# Patient Record
Sex: Female | Born: 1970 | Race: Black or African American | Hispanic: No | Marital: Married | Smoking: Never smoker
Health system: Southern US, Community
[De-identification: ages and names within clinical notes are randomized; demographics above are authoritative.]

## PROBLEM LIST (undated history)

## (undated) HISTORY — PX: CARPAL TUNNEL RELEASE: SHX101

---

## 2015-04-07 ENCOUNTER — Inpatient Hospital Stay
Admission: EM | Admit: 2015-04-07 | Discharge: 2015-04-09 | DRG: 195 | Disposition: A | Discharge status: Home / Self Care | Payer: Self-pay | Attending: Internal Medicine | Admitting: Internal Medicine

## 2015-04-07 ENCOUNTER — Encounter: Payer: Self-pay | Admitting: Student in an Organized Health Care Education/Training Program

## 2015-04-07 ENCOUNTER — Inpatient Hospital Stay: Payer: Self-pay | Admitting: Student in an Organized Health Care Education/Training Program

## 2015-04-07 ENCOUNTER — Emergency Department: Payer: Self-pay

## 2015-04-07 DIAGNOSIS — R062 Wheezing: Secondary | ICD-10-CM | POA: Diagnosis present

## 2015-04-07 DIAGNOSIS — J189 Pneumonia, unspecified organism: Principal | ICD-10-CM | POA: Diagnosis present

## 2015-04-07 DIAGNOSIS — R0682 Tachypnea, not elsewhere classified: Secondary | ICD-10-CM | POA: Diagnosis present

## 2015-04-07 DIAGNOSIS — R059 Cough, unspecified: Secondary | ICD-10-CM

## 2015-04-07 DIAGNOSIS — R739 Hyperglycemia, unspecified: Secondary | ICD-10-CM | POA: Diagnosis present

## 2015-04-07 DIAGNOSIS — E876 Hypokalemia: Secondary | ICD-10-CM | POA: Diagnosis present

## 2015-04-07 DIAGNOSIS — R030 Elevated blood-pressure reading, without diagnosis of hypertension: Secondary | ICD-10-CM | POA: Diagnosis present

## 2015-04-07 LAB — COMPREHENSIVE METABOLIC PANEL
ALT: 7 U/L (ref 0–55)
ALT: 7 U/L (ref 0–55)
AST (SGOT): 17 U/L (ref 5–34)
AST (SGOT): 15 U/L (ref 5–34)
Albumin/Globulin Ratio: 1.2 (ref 0.9–2.2)
Albumin/Globulin Ratio: 1.1 (ref 0.9–2.2)
Albumin: 3.7 g/dL (ref 3.5–5.0)
Albumin: 3.4 g/dL — ABNORMAL LOW (ref 3.5–5.0)
Alkaline Phosphatase: 50 U/L (ref 37–106)
Alkaline Phosphatase: 41 U/L (ref 37–106)
Anion Gap: 10 (ref 5.0–15.0)
Anion Gap: 10 (ref 5.0–15.0)
BUN: 13 mg/dL (ref 7.0–19.0)
BUN: 11 mg/dL (ref 7.0–19.0)
Bilirubin, Total: 0.2 mg/dL (ref 0.2–1.2)
Bilirubin, Total: 0.1 mg/dL — ABNORMAL LOW (ref 0.2–1.2)
CO2: 20 mEq/L — ABNORMAL LOW (ref 22–29)
CO2: 17 mEq/L — ABNORMAL LOW (ref 22–29)
Calcium: 8.7 mg/dL (ref 8.5–10.5)
Calcium: 7.6 mg/dL — ABNORMAL LOW (ref 8.5–10.5)
Chloride: 110 mEq/L (ref 100–111)
Chloride: 112 mEq/L — ABNORMAL HIGH (ref 100–111)
Creatinine: 0.8 mg/dL (ref 0.6–1.0)
Creatinine: 0.8 mg/dL (ref 0.6–1.0)
Globulin: 3.1 g/dL (ref 2.0–3.6)
Globulin: 3 g/dL (ref 2.0–3.6)
Glucose: 81 mg/dL (ref 70–100)
Glucose: 207 mg/dL — ABNORMAL HIGH (ref 70–100)
Potassium: 4.6 mEq/L (ref 3.5–5.1)
Potassium: 3 mEq/L — ABNORMAL LOW (ref 3.5–5.1)
Protein, Total: 6.8 g/dL (ref 6.0–8.3)
Protein, Total: 6.4 g/dL (ref 6.0–8.3)
Sodium: 140 mEq/L (ref 136–145)
Sodium: 139 mEq/L (ref 136–145)

## 2015-04-07 LAB — GFR
EGFR: >60
EGFR: >60

## 2015-04-07 LAB — CBC AND DIFFERENTIAL
Basophils Absolute Automated: 0.02 10*3/uL (ref 0.00–0.20)
Basophils Absolute Automated: 0.01 10*3/uL (ref 0.00–0.20)
Basophils Automated: 0 %
Basophils Automated: 0 %
Eosinophils Absolute Automated: 0.39 10*3/uL (ref 0.00–0.70)
Eosinophils Absolute Automated: 0.02 10*3/uL (ref 0.00–0.70)
Eosinophils Automated: 6 %
Eosinophils Automated: 0 %
Hematocrit: 36.5 % — ABNORMAL LOW (ref 37.0–47.0)
Hematocrit: 32.9 % — ABNORMAL LOW (ref 37.0–47.0)
Hgb: 11.4 g/dL — ABNORMAL LOW (ref 12.0–16.0)
Hgb: 10.3 g/dL — ABNORMAL LOW (ref 12.0–16.0)
Immature Granulocytes Absolute: 0.01 10*3/uL
Immature Granulocytes Absolute: 0.03 10*3/uL
Immature Granulocytes: 0 %
Immature Granulocytes: 0 %
Lymphocytes Absolute Automated: 2.84 10*3/uL (ref 0.50–4.40)
Lymphocytes Absolute Automated: 0.7 10*3/uL (ref 0.50–4.40)
Lymphocytes Automated: 42 %
Lymphocytes Automated: 8 %
MCH: 27.4 pg — ABNORMAL LOW (ref 28.0–32.0)
MCH: 27.5 pg — ABNORMAL LOW (ref 28.0–32.0)
MCHC: 31.2 g/dL — ABNORMAL LOW (ref 32.0–36.0)
MCHC: 31.3 g/dL — ABNORMAL LOW (ref 32.0–36.0)
MCV: 87.7 fL (ref 80.0–100.0)
MCV: 87.7 fL (ref 80.0–100.0)
MPV: 9.8 fL (ref 9.4–12.3)
MPV: 10.1 fL (ref 9.4–12.3)
Monocytes Absolute Automated: 0.91 10*3/uL (ref 0.00–1.20)
Monocytes Absolute Automated: 0.2 10*3/uL (ref 0.00–1.20)
Monocytes: 13 %
Monocytes: 2 %
Neutrophils Absolute: 2.68 10*3/uL (ref 1.80–8.10)
Neutrophils Absolute: 8.46 10*3/uL — ABNORMAL HIGH (ref 1.80–8.10)
Neutrophils: 39 %
Neutrophils: 90 %
Nucleated RBC: 0 /100 WBC (ref 0–1)
Nucleated RBC: 0 /100 WBC (ref 0–1)
Platelets: 254 10*3/uL (ref 140–400)
Platelets: 235 10*3/uL (ref 140–400)
RBC: 4.16 10*6/uL — ABNORMAL LOW (ref 4.20–5.40)
RBC: 3.75 10*6/uL — ABNORMAL LOW (ref 4.20–5.40)
RDW: 14 % (ref 12–15)
RDW: 14 % (ref 12–15)
WBC: 6.84 10*3/uL (ref 3.50–10.80)
WBC: 9.39 10*3/uL (ref 3.50–10.80)

## 2015-04-07 LAB — HEMOLYSIS INDEX
Hemolysis Index: 10 (ref 0–18)
Hemolysis Index: 1 (ref 0–18)

## 2015-04-07 LAB — MAGNESIUM: Magnesium: 1.5 mg/dL — ABNORMAL LOW (ref 1.6–2.6)

## 2015-04-07 LAB — PHOSPHORUS: Phosphorus: 2.7 mg/dL (ref 2.3–4.7)

## 2015-04-07 MED ORDER — HYDROXYZINE HCL 10 MG PO TABS
10.0000 mg | ORAL_TABLET | Freq: Once | ORAL | Status: AC
Start: 2015-04-07 — End: 2015-04-07
  Administered 2015-04-07: 10 mg via ORAL
  Filled 2015-04-07: qty 1

## 2015-04-07 MED ORDER — HYDROCODONE-ACETAMINOPHEN 5-325 MG PO TABS
1.0000 | ORAL_TABLET | Freq: Once | ORAL | Status: AC
Start: 2015-04-07 — End: 2015-04-07
  Administered 2015-04-07: 1 via ORAL
  Filled 2015-04-07: qty 1

## 2015-04-07 MED ORDER — ACETAMINOPHEN 325 MG PO TABS
650.0000 mg | ORAL_TABLET | ORAL | Status: DC | PRN
Start: 2015-04-07 — End: 2015-04-09
  Administered 2015-04-07 – 2015-04-08 (×4): 650 mg via ORAL
  Filled 2015-04-07 (×4): qty 2

## 2015-04-07 MED ORDER — RACEPINEPHRINE HCL 2.25 % IN NEBU
INHALATION_SOLUTION | RESPIRATORY_TRACT | Status: AC
Start: 2015-04-07 — End: 2015-04-07
  Administered 2015-04-07: 0.5 mL
  Filled 2015-04-07: qty 0.5

## 2015-04-07 MED ORDER — BENZOCAINE-MENTHOL 15-3.6 MG MT LOZG
1.0000 | LOZENGE | OROMUCOSAL | Status: DC | PRN
Start: 2015-04-07 — End: 2015-04-09
  Administered 2015-04-07 (×2): 1 via BUCCAL
  Filled 2015-04-07 (×24): qty 1

## 2015-04-07 MED ORDER — ONDANSETRON HCL 4 MG/2ML IJ SOLN
4.0000 mg | Freq: Four times a day (QID) | INTRAMUSCULAR | Status: DC | PRN
Start: 2015-04-07 — End: 2015-04-09

## 2015-04-07 MED ORDER — ALBUTEROL-IPRATROPIUM 2.5-0.5 (3) MG/3ML IN SOLN
3.0000 mL | RESPIRATORY_TRACT | Status: DC
Start: 2015-04-07 — End: 2015-04-09
  Administered 2015-04-07 – 2015-04-09 (×13): 3 mL via RESPIRATORY_TRACT
  Filled 2015-04-07 (×16): qty 3

## 2015-04-07 MED ORDER — CYCLOBENZAPRINE HCL 10 MG PO TABS
10.0000 mg | ORAL_TABLET | Freq: Three times a day (TID) | ORAL | Status: DC | PRN
Start: 2015-04-07 — End: 2015-04-09
  Administered 2015-04-07 – 2015-04-08 (×2): 10 mg via ORAL
  Filled 2015-04-07 (×4): qty 1

## 2015-04-07 MED ORDER — SODIUM CHLORIDE 0.9 % IV BOLUS
1000.0000 mL | Freq: Once | INTRAVENOUS | Status: AC
Start: 2015-04-07 — End: 2015-04-07
  Administered 2015-04-07: 1000 mL via INTRAVENOUS

## 2015-04-07 MED ORDER — ENOXAPARIN SODIUM 40 MG/0.4ML SC SOLN
40.0000 mg | Freq: Every day | SUBCUTANEOUS | Status: DC
Start: 2015-04-07 — End: 2015-04-09
  Filled 2015-04-07 (×2): qty 0.4

## 2015-04-07 MED ORDER — ACETAMINOPHEN 650 MG RE SUPP
650.0000 mg | RECTAL | Status: DC | PRN
Start: 2015-04-07 — End: 2015-04-09

## 2015-04-07 MED ORDER — SODIUM CHLORIDE 0.9 % IV MBP
1.0000 g | Freq: Once | INTRAVENOUS | Status: AC
Start: 2015-04-07 — End: 2015-04-07
  Administered 2015-04-07: 1 g via INTRAVENOUS
  Filled 2015-04-07: qty 100
  Filled 2015-04-07: qty 1000

## 2015-04-07 MED ORDER — SODIUM CHLORIDE 0.9 % IV SOLN
INTRAVENOUS | Status: DC
Start: 2015-04-07 — End: 2015-04-08

## 2015-04-07 MED ORDER — SODIUM CHLORIDE 0.9 % IV MBP
1.0000 g | INTRAVENOUS | Status: DC
Start: 2015-04-08 — End: 2015-04-09
  Administered 2015-04-07 – 2015-04-09 (×2): 1 g via INTRAVENOUS
  Filled 2015-04-07: qty 100
  Filled 2015-04-07 (×2): qty 1000
  Filled 2015-04-07: qty 100

## 2015-04-07 MED ORDER — PREDNISONE 20 MG PO TABS
40.0000 mg | ORAL_TABLET | Freq: Every morning | ORAL | Status: DC
Start: 2015-04-07 — End: 2015-04-08
  Administered 2015-04-07 – 2015-04-08 (×2): 40 mg via ORAL
  Filled 2015-04-07 (×2): qty 2

## 2015-04-07 MED ORDER — GUAIFENESIN-CODEINE 100-10 MG/5ML PO SYRP
10.0000 mL | ORAL_SOLUTION | ORAL | Status: DC | PRN
Start: 2015-04-07 — End: 2015-04-09
  Administered 2015-04-07 – 2015-04-09 (×8): 10 mL via ORAL
  Filled 2015-04-07 (×8): qty 10

## 2015-04-07 MED ORDER — RACEPINEPHRINE HCL 2.25 % IN NEBU
0.5000 mL | INHALATION_SOLUTION | Freq: Once | RESPIRATORY_TRACT | Status: DC
Start: 2015-04-07 — End: 2015-04-09
  Filled 2015-04-07: qty 0.5

## 2015-04-07 MED ORDER — IPRATROPIUM BROMIDE 0.02 % IN SOLN
0.5000 mg | Freq: Once | RESPIRATORY_TRACT | Status: AC
Start: 2015-04-07 — End: 2015-04-07
  Administered 2015-04-07: 0.5 mg via RESPIRATORY_TRACT
  Filled 2015-04-07: qty 2.5

## 2015-04-07 MED ORDER — METHYLPREDNISOLONE SODIUM SUCC 125 MG IJ SOLR
125.0000 mg | Freq: Once | INTRAMUSCULAR | Status: AC
Start: 2015-04-07 — End: 2015-04-07
  Administered 2015-04-07: 125 mg via INTRAVENOUS
  Filled 2015-04-07: qty 2

## 2015-04-07 MED ORDER — LIDOCAINE HCL 2 % IJ SOLN
INTRAMUSCULAR | Status: AC
Start: 2015-04-07 — End: 2015-04-07
  Filled 2015-04-07: qty 20

## 2015-04-07 MED ORDER — BENZONATATE 100 MG PO CAPS
200.0000 mg | ORAL_CAPSULE | Freq: Once | ORAL | Status: AC
Start: 2015-04-07 — End: 2015-04-07
  Administered 2015-04-07: 200 mg via ORAL
  Filled 2015-04-07: qty 2

## 2015-04-07 MED ORDER — BENZONATATE 100 MG PO CAPS
100.0000 mg | ORAL_CAPSULE | Freq: Three times a day (TID) | ORAL | Status: DC | PRN
Start: 2015-04-07 — End: 2015-04-09
  Administered 2015-04-07: 100 mg via ORAL
  Filled 2015-04-07: qty 1

## 2015-04-07 MED ORDER — ALBUTEROL SULFATE (2.5 MG/3ML) 0.083% IN NEBU
7.5000 mg | INHALATION_SOLUTION | Freq: Once | RESPIRATORY_TRACT | Status: AC
Start: 2015-04-07 — End: 2015-04-07
  Administered 2015-04-07: 7.5 mg via RESPIRATORY_TRACT
  Filled 2015-04-07: qty 9

## 2015-04-07 MED ORDER — AZITHROMYCIN 250 MG PO TABS
500.0000 mg | ORAL_TABLET | Freq: Once | ORAL | Status: AC
Start: 2015-04-07 — End: 2015-04-07
  Administered 2015-04-07: 500 mg via ORAL
  Filled 2015-04-07: qty 2

## 2015-04-07 MED ORDER — SODIUM CHLORIDE 0.9 % IV SOLN
250.0000 mg | INTRAVENOUS | Status: DC
Start: 2015-04-08 — End: 2015-04-09
  Administered 2015-04-08 – 2015-04-09 (×2): 250 mg via INTRAVENOUS
  Filled 2015-04-07 (×3): qty 250

## 2015-04-07 MED ORDER — SODIUM CHLORIDE 0.9 % IN NEBU
3.0000 mL | INHALATION_SOLUTION | Freq: Once | RESPIRATORY_TRACT | Status: AC
Start: 2015-04-07 — End: 2015-04-07
  Administered 2015-04-07: 3 mL via RESPIRATORY_TRACT

## 2015-04-07 MED ORDER — NALOXONE HCL 0.4 MG/ML IJ SOLN (WRAP)
0.2000 mg | INTRAMUSCULAR | Status: DC | PRN
Start: 2015-04-07 — End: 2015-04-09

## 2015-04-07 MED ORDER — GUAIFENESIN ER 600 MG PO TB12
600.0000 mg | ORAL_TABLET | Freq: Two times a day (BID) | ORAL | Status: DC
Start: 2015-04-07 — End: 2015-04-09
  Administered 2015-04-07 – 2015-04-09 (×5): 600 mg via ORAL
  Filled 2015-04-07 (×6): qty 1

## 2015-04-07 MED ORDER — POTASSIUM CHLORIDE CRYS ER 20 MEQ PO TBCR
40.0000 meq | EXTENDED_RELEASE_TABLET | Freq: Once | ORAL | Status: AC
Start: 2015-04-07 — End: 2015-04-07
  Administered 2015-04-07: 40 meq via ORAL
  Filled 2015-04-07: qty 2

## 2015-04-07 MED ORDER — TRAZODONE HCL 50 MG PO TABS
50.0000 mg | ORAL_TABLET | Freq: Every evening | ORAL | Status: DC
Start: 2015-04-07 — End: 2015-04-09
  Administered 2015-04-07 – 2015-04-08 (×3): 50 mg via ORAL
  Filled 2015-04-07 (×3): qty 1

## 2015-04-07 MED ORDER — ONDANSETRON 4 MG PO TBDP
4.0000 mg | ORAL_TABLET | Freq: Four times a day (QID) | ORAL | Status: DC | PRN
Start: 2015-04-07 — End: 2015-04-09

## 2015-04-07 NOTE — ED Notes (Signed)
Bed: GR12  Expected date:   Expected time:   Means of arrival:   Comments:

## 2015-04-07 NOTE — ED Notes (Signed)
Pt presents with SOB with a cough and wheezing. Pt denies having asthma, but has constant coughing and inability to catch her breath. Symptoms started 2 days ago.

## 2015-04-07 NOTE — ED Provider Notes (Signed)
EMERGENCY DEPARTMENT HISTORY AND PHYSICAL EXAM     Physician/Midlevel provider first contact with patient: 04/07/15 0110         Date: 04/07/2015  Patient Name: Wendy Buckley    History of Presenting Illness     Chief Complaint   Patient presents with   . Shortness of Breath   . Chest Pain   . Cough       History Provided By: Patient    Chief Complaint: Cough  Onset: Two days ago  Timing: Intermittent  Location: Respiratory  Severity: Moderate  Associated Symptoms: Chest pain, wheezing, and SOB    Additional History: Wendy Buckley is a 45 y.o. female presenting to the ED complaining of an intermittent cough, which began two days ago. Pt notes no hx of asthma and does not smoke. Pt notes associated chest pain, wheezing, and SOB.       PCP: No primary care provider on file.  SPECIALISTS:    Current Facility-Administered Medications   Medication Dose Route Frequency Provider Last Rate Last Dose   . cefTRIAXone (ROCEPHIN) 1 g in sodium chloride 0.9 % 100 mL IVPB mini-bag plus  1 g Intravenous Once Cherlyn Roberts, MD 200 mL/hr at 04/07/15 0233 1 g at 04/07/15 0233   . guaiFENesin-codeine (ROBITUSSIN AC) 100-10 MG/5ML syrup 10 mL  10 mL Oral Q4H PRN Cherlyn Roberts, MD   10 mL at 04/07/15 0215   . lidocaine (XYLOCAINE) 2 % injection            . racepinephrine (VAPONEFRIN) 2.25 % nebulizer solution 0.5 mL  0.5 mL Nebulization Once Cherlyn Roberts, MD         No current outpatient prescriptions on file.       Past History     Past Medical History:  No past medical history on file.    Past Surgical History:  No past surgical history on file.    Family History:  No family history on file.    Social History:  Social History   Substance Use Topics   . Smoking status: Not on file   . Smokeless tobacco: Not on file   . Alcohol Use: Not on file       Allergies:  No Known Allergies    Review of Systems     Constitutional: Negative for fever or chills.   Neurological: Negative for speech changes, weakness, or  numbness.  Eyes: Negative for visual changes or eye pain.  HENT: No headache. Negative for sore throat, neck pain, or runny nose.   Cardiovascular: Positive for chest pain.   Respiratory: Positive for shortness of breath, wheezing, and cough.  Gastrointestinal: Negative for abdominal pain, nausea, vomiting, diarrhea, or blood in stool.   Genitourinary: Negative for dysuria or hematuria.  Musculoskeletal: Negative for gait changes, joint pain or muscle pain.   Skin: Negative for itching or rash.   Hematological: Negative for easy bruising      Physical Exam   BP 145/82 mmHg  Pulse 112  Temp(Src) 98.5 F (36.9 C) (Temporal Artery)  Resp 22  SpO2 99%    Physical Exam   Constitutional: Oriented to person, place, and time and well-developed, well-nourished. Actively coughing, moderate respiratory distress.  Head: Normocephalic and atraumatic.   Mouth/Throat: Oropharynx is clear and moist.   Eyes: Conjunctivae normal and EOM are normal. Pupils are equal, round, and reactive to light.    Neck: Normal range of motion. Neck supple.   Cardiovascular: Normal rate, regular  rhythm, normal heart sounds and intact distal pulses.  No murmur heard.  Pulmonary/Chest: Audile expiratory wheezing with intermittent stridor likely secondary to forced exhalation. Bilateral rhonchi and wheezing.    Abdominal: Soft. Non distended. Non tender. No rebound or guarding  Musculoskeletal: No peripheral edema. No calf swelling or tenderness.    Neurological: Patient is alert and oriented to person, place, and time. No cranial nerve deficit. Gait normal. GCS score is 15.   Skin: Skin is warm and dry. No rash  Psychiatric: Affect normal.       Diagnostic Study Results     Labs -     Results     Procedure Component Value Units Date/Time    Influenza A/B Virus Antigen [[id number]] Collected:  04/07/15 0130    Specimen Information:  Nasopharyngeal from Nasal Aspirate Updated:  04/07/15 0215    Narrative:      ORDER#: [id number]                                     ORDERED BY: Audley Hose  SOURCE: Nasal Aspirate                               COLLECTED:  04/07/15 01:30  ANTIBIOTICS AT COLL.:                                RECEIVED :  04/07/15 01:47  Influenza Rapid Antigen A&B                FINAL       04/07/15 02:15  04/07/15   Negative for Influenza A and B             Reference Range: Negative      Comprehensive Metabolic Panel (CMP) [[id number]]  (Abnormal) Collected:  04/07/15 0130    Specimen Information:  Blood Updated:  04/07/15 0203     Glucose 81 mg/dL      BUN 96.2 mg/dL      Creatinine 0.8 mg/dL      Sodium 952 mEq/L      Potassium 4.6 mEq/L      Chloride 110 mEq/L      CO2 20 (L) mEq/L      Calcium 8.7 mg/dL      Protein, Total 6.8 g/dL      Albumin 3.7 g/dL      AST (SGOT) 17 U/L      ALT 7 U/L      Alkaline Phosphatase 50 U/L      Bilirubin, Total 0.2 mg/dL      Globulin 3.1 g/dL      Albumin/Globulin Ratio 1.2      Anion Gap 10.0     Hemolysis index [[id number]] Collected:  04/07/15 0130     Hemolysis Index 10 Updated:  04/07/15 0203    GFR [[id number]] Collected:  04/07/15 0130     EGFR >60.0 Updated:  04/07/15 0203    CBC and differential [[id number]]  (Abnormal) Collected:  04/07/15 0130    Specimen Information:  Blood from Blood Updated:  04/07/15 0151     WBC 6.84 x10 3/uL      Hgb 11.4 (L) g/dL      Hematocrit 56.3 (L) %      Platelets 254 x10 3/uL  RBC 4.16 (L) x10 6/uL      MCV 87.7 fL      MCH 27.4 (L) pg      MCHC 31.2 (L) g/dL      RDW 14 %      MPV 9.8 fL      Neutrophils 39 %      Lymphocytes Automated 42 %      Monocytes 13 %      Eosinophils Automated 6 %      Basophils Automated 0 %      Immature Granulocyte 0 %      Nucleated RBC 0 /100 WBC      Neutrophils Absolute 2.68 x10 3/uL      Abs Lymph Automated 2.84 x10 3/uL      Abs Mono Automated 0.91 x10 3/uL      Abs Eos Automated 0.39 x10 3/uL      Absolute Baso Automated 0.02 x10 3/uL      Absolute Immature Granulocyte 0.01 x10 3/uL           Radiologic Studies -   Radiology  Results (24 Hour)     Procedure Component Value Units Date/Time    Chest AP Portable [[id number]] Collected:  04/07/15 4098    Order Status:  Completed Updated:  04/07/15 0218    Narrative:      Examination: Portable chest.    HISTORY: Cough, shortness of breath.    COMPARISON: None.    FINDINGS:  Left lower lobe infiltrate.  No pleural collections.  Heart size normal.      Impression:        Left lower lobe pneumonia.    Adaline Sill, MD   04/07/2015 2:13 AM        .    Medical Decision Making   I am the first provider for this patient.    I reviewed the vital signs, available nursing notes, past medical history, past surgical history, family history and social history.    Vital Signs-Reviewed the patient's vital signs.     Patient Vitals for the past 12 hrs:   BP Temp Pulse Resp   04/07/15 0241 145/82 mmHg - (!) 112 22   04/07/15 0056 (!) 180/112 mmHg 98.5 F (36.9 C) 92 (!) 32       Pulse Oximetry Analysis - Normal 100% on RA    EKG:  Interpreted by the EP.   Time Interpreted: 1:06   Rate: 92   Rhythm: Sinus rhythm   Interpretation: Sinus Rhythm. Low voltage. Normal PR. Normal QRS. No ectopy. No significant ST changes. No STEMI.   Comparison: No prior study is available for comparison.      Admit Decision Time:  The decision to admit this patient was made by the emergency provider at 2:40 on 04/07/2015     Old Medical Records: Nursing notes.     ED Course:   2:22 AM - Discussed pt case with Dr. Merlene Laughter, [facility], who will come see pt.    2:40 AM - Discussed pt case with Dr. Merlene Laughter, [facility], who agrees to accept pt.    Provider: 25 YOF with cough in moderate resp distress with bilateral expiratory wheezing. Pt given continuous neb, solumedrol, magnesium in ED. Some improvement of sx, but still having sig wheezing. CXR with PNA. Azithromycin and rocephin given. Will admit for observation for further neb treatments.       Diagnosis     Clinical Impression:   1. Pneumonia due to  infectious organism, unspecified laterality,  unspecified part of lung    2. Wheezing        Treatment Plan:   ED Disposition     Admit Admitting Physician: Derek Mound [[id number]]  Diagnosis: CAP (community acquired pneumonia) [811914]  Estimated Length of Stay: > or = to 2 midnights  Tentative Discharge Plan?: Home or Self Care [1]  Patient Class: Inpatient [101]              _______________________________      Attestations: This note is prepared by Ree Edman, acting as scribe for Audley Hose, MD. The scribe's documentation has been prepared under my direction and personally reviewed by me in its entirety.  I confirm that the note above accurately reflects all work, treatment, procedures, and medical decision making performed by me.    _______________________________    Cherlyn Roberts, MD  04/13/15 [phone]

## 2015-04-07 NOTE — Progress Notes (Signed)
[facility]  PROGRESS NOTE      Patient: Wendy Buckley  Date: 04/07/2015   LOS: 0 Days  Admission Date: 04/07/2015   MRN: [id number]  Attending: Durene Romans Deonta Bomberger    7 AM to 7 PM: Please contact me on the following pager [phone]  After 7 PM: Contact the night hospitalist         ASSESSMENT/PLAN     45 y.o. female with a PMHx of none who presented with cough, chest pain and fevers that started two days ago. Admitted for CAP. Was given racemic epinephrine for concerns of ?stridor.      Plan for the day   Continue antibiotic treatment   Follow cultures  Possible [place] in the am. Needs a PCP.      Active Problems:    CAP (community acquired pneumonia)    Hypokalemia    Hyperglycemia    Cough        Pneumonia community acquired  Image: CXR    Plan  - Flu negative  - Strep pneumo ag, legionella urine  - Duonebs and albuterol.   - Blood cultures x 2  - Antibiotics: yes      HypoK  Due to poor po intake?  Replete <4.0    ?Stridor  Given racemic epi.   Will continue prednisone and assess in the AM.     Cough  Impression: related to pneumonia  Plan:  -     mucinex      Analgesia:  norco    Nutrition: regular    DVT Prophylaxis:enoxaparin subq        Code Status: full    DISPO: home    Family Contact: none       SUBJECTIVE     Wendy Buckley states that her breathing is improved, continues to cough. Afebrile HDS.       MEDICATIONS     Current Facility-Administered Medications   Medication Dose Route Frequency   . albuterol-ipratropium  3 mL Nebulization Q4H SCH   . [START ON 04/08/2015] azithromycin  250 mg Intravenous Q24H   . [START ON 04/08/2015] cefTRIAXone  1 g Intravenous Q24H   . enoxaparin  40 mg Subcutaneous Daily   . guaiFENesin  600 mg Oral Q12H SCH   . predniSONE  40 mg Oral QAM W/BREAKFAST   . racepinephrine  0.5 mL Nebulization Once   . traZODone  50 mg Oral QHS       PHYSICAL EXAM     Filed Vitals:    04/07/15 1109   BP: 104/56   Pulse: 99   Temp: 96.8 F (36 C)   Resp: 20   SpO2: 98%       Temperature: Temp  Min: 96.8 F (36  C)  Max: 98.5 F (36.9 C)  Pulse: Pulse  Min: 92  Max: 116  Respiratory: Resp  Min: 18  Max: 32  Non-Invasive BP: BP  Min: 104/56  Max: 180/112  Pulse Oximetry SpO2  Min: 98 %  Max: 100 %    Intake and Output Summary (Last 24 hours) at Date Time    Intake/Output Summary (Last 24 hours) at 04/07/15 1329  Last data filed at 04/07/15 1109   Gross per 24 hour   Intake 391.67 ml   Output      0 ml   Net 391.67 ml         GEN APPEARANCE: Normal;  A&OX3  HEENT: PERLA; EOMI; Conjunctiva Clear  NECK: Supple;  No bruits  CVS: RRR, S1, S2; No M/G/R  LUNGS: bilaterally; mild Wheezes;   ABD: Soft; No TTP; + Normoactive BS  EXT: No edema; Pulses 2+ and intact  Skin exam:  pink  NEURO: CN 2-12 intact; No Focal neurological deficits  CAP REFILL:  Normal  MENTAL STATUS:  Normal        LABS       Recent Labs  Lab 04/07/15  0504 04/07/15  0130   WBC 9.39 6.84   RBC 3.75* 4.16*   HGB 10.3* 11.4*   HEMATOCRIT 32.9* 36.5*   MCV 87.7 87.7   PLATELETS 235 254         Recent Labs  Lab 04/07/15  0504 04/07/15  0130   SODIUM 139 140   POTASSIUM 3.0* 4.6   CHLORIDE 112* 110   CO2 17* 20*   BUN 11.0 13.0   CREATININE 0.8 0.8   GLUCOSE 207* 81   CALCIUM 7.6* 8.7   MAGNESIUM 1.5*  --          Recent Labs  Lab 04/07/15  0504 04/07/15  0130   ALT 7 7   AST (SGOT) 15 17   BILIRUBIN, TOTAL 0.1* 0.2   ALBUMIN 3.4* 3.7   ALKALINE PHOSPHATASE 41 50                     Microbiology Results     Procedure Component Value Units Date/Time    CULTURE BLOOD AEROBIC AND ANAEROBIC [[id number]] Collected:  04/07/15 0314    Specimen Information:  Blood, Intravenous Line Updated:  04/07/15 0946    Narrative:      1 BLUE+1 PURPLE    CULTURE BLOOD AEROBIC AND ANAEROBIC [[id number]] Collected:  04/07/15 0314    Specimen Information:  Blood, Intravenous Line Updated:  04/07/15 0946    Narrative:      1 BLUE+1 PURPLE    Influenza A/B Virus Antigen [[id number]] Collected:  04/07/15 0130    Specimen Information:  Nasopharyngeal from Nasal Aspirate Updated:  04/07/15 0215     Narrative:      ORDER#: [id number]                                    ORDERED BY: Audley Hose  SOURCE: Nasal Aspirate                               COLLECTED:  04/07/15 01:30  ANTIBIOTICS AT COLL.:                                RECEIVED :  04/07/15 01:47  Influenza Rapid Antigen A&B                FINAL       04/07/15 02:15  04/07/15   Negative for Influenza A and B             Reference Range: Negative             RADIOLOGY     Upon my review:    Wendy Buckley  1:29 PM 04/07/2015

## 2015-04-07 NOTE — Plan of Care (Signed)
Problem: Hemodynamic Status - Pneumonia  Goal: Vital signs and fluid balance maintained/improved  Respiratory status will be back to baseline.   Outcome: Progressing  he patient's learning abilities have been assessed. Today's individualized plan of care to monitor respiratory status, give scheduled medications and PRN medicines as needed, continue IVF NS @ 137ml/hr, monitor and manage pain (sore throat, headache and at times chest pain when coughing), assist patient to ambulate was discussed with the patient and patient agreed to it. Patient demonstrates understanding of disease process, treatment plan, medications.  and consequences of noncompliance. All questions and concerns were addressed.   A&Ox4, ST in the low 100's, on breathing treatment, in room air Sa02 98%, fits of non productive cough at times (patient at times can't stop coughing, patient states she gets green phlegm but I didn't see it when I was with patient), IVF of NS @100ml /hr, given Tylenol for headache, Trazodone given as well.

## 2015-04-07 NOTE — H&P (Signed)
[facility]      Patient: Wendy Buckley  Date: 04/07/2015   DOB: 09/28/1970  Admission Date: 04/07/2015   MRN: [id number]  Attending: Derek Mound         Chief Complaint   Patient presents with   . Shortness of Breath   . Chest Pain   . Cough      History Gathered From: Patient     HISTORY AND PHYSICAL     Wendy Buckley is a 45 y.o. female with a PMHx of none who presented with cough, chest pain and fevers that started two days ago. Pt reports productive cough, green sputum in nature. She reports fevers as high as 102. Pt reports audible wheezing. Pt also reports diarrhea. Pt reports abdominal pain from coughing. She denies any sick contacts. Denies any hx of pneumonia. Denies tobacco abuse. Pt works at [employer], states that she thinks that she got sick there. She denies hx of asthma/COPD.      No past medical history on file.    No past surgical history on file.    Prior to Admission medications    Not on File       No Known Allergies    CODE STATUS: Full     PRIMARY CARE MD: [name], MD    Mother: Ovarian ca     Social History   Substance Use Topics   . Smoking status: Never Smoker    . Smokeless tobacco: Not on file   . Alcohol Use: No       REVIEW OF SYSTEMS   Positive for: chest pain, cough, fevers, diarrhea, abdominal pain   Negative for: nausea/vomiting, sick contacts, weakness, headaches    All ROS completed and otherwise negative.    PHYSICAL EXAM     Vital Signs (most recent): BP 145/82 mmHg  Pulse 112  Temp(Src) 98.5 F (36.9 C) (Temporal Artery)  Resp 22  SpO2 99%  Constitutional: Mild distress. Patient speaks freely in full sentences.   HEENT: NC/AT, PERRL, no scleral icterus or conjunctival pallor, no nasal discharge, MMM, oropharynx without erythema or exudate  Neck: trachea midline, supple, no cervical or supraclavicular lymphadenopathy or masses  Cardiovascular: tachycardia, normal S1 S2, no murmurs, gallops, palpable thrills, no JVD, Non-displaced PMI.  Respiratory: Normal  rate. No retractions or increased work of breathing. Ronchi all over   Gastrointestinal: +BS, non-distended, soft, non-tender, no rebound or guarding, no hepatosplenomegaly  Genitourinary: no suprapubic or costovertebral angle tenderness  Musculoskeletal: ROM and motor strength grossly normal. No clubbing, edema, or cyanosis. DP and radial pulses 2+ and symmetric.  Skin exam:  pink  Neurologic: EOMI, CN 2-12 grossly intact. no gross motor or sensory deficits  Psychiatric: AAOx3, affect and mood appropriate. The patient is alert, interactive, appropriate.  Capillary refill:  Normal    Exam done by Derek Mound, MD on 04/07/2015 at 2:45 AM      LABS & IMAGING     Recent Results (from the past 24 hour(s))   CBC and differential    Collection Time: 04/07/15  1:30 AM   Result Value Ref Range    WBC 6.84 3.50 - 10.80 x10 3/uL    Hgb 11.4 (L) 12.0 - 16.0 g/dL    Hematocrit 47.8 (L) 37.0 - 47.0 %    Platelets 254 140 - 400 x10 3/uL    RBC 4.16 (L) 4.20 - 5.40 x10 6/uL    MCV 87.7 80.0 - 100.0 fL  MCH 27.4 (L) 28.0 - 32.0 pg    MCHC 31.2 (L) 32.0 - 36.0 g/dL    RDW 14 12 - 15 %    MPV 9.8 9.4 - 12.3 fL    Neutrophils 39 None %    Lymphocytes Automated 42 None %    Monocytes 13 None %    Eosinophils Automated 6 None %    Basophils Automated 0 None %    Immature Granulocyte 0 None %    Nucleated RBC 0 0 - 1 /100 WBC    Neutrophils Absolute 2.68 1.80 - 8.10 x10 3/uL    Abs Lymph Automated 2.84 0.50 - 4.40 x10 3/uL    Abs Mono Automated 0.91 0.00 - 1.20 x10 3/uL    Abs Eos Automated 0.39 0.00 - 0.70 x10 3/uL    Absolute Baso Automated 0.02 0.00 - 0.20 x10 3/uL    Absolute Immature Granulocyte 0.01 0 x10 3/uL   Comprehensive Metabolic Panel (CMP)    Collection Time: 04/07/15  1:30 AM   Result Value Ref Range    Glucose 81 70 - 100 mg/dL    BUN 47.8 7.0 - 29.5 mg/dL    Creatinine 0.8 0.6 - 1.0 mg/dL    Sodium 621 308 - 657 mEq/L    Potassium 4.6 3.5 - 5.1 mEq/L    Chloride 110 100 - 111 mEq/L    CO2 20 (L) 22 - 29 mEq/L     Calcium 8.7 8.5 - 10.5 mg/dL    Protein, Total 6.8 6.0 - 8.3 g/dL    Albumin 3.7 3.5 - 5.0 g/dL    AST (SGOT) 17 5 - 34 U/L    ALT 7 0 - 55 U/L    Alkaline Phosphatase 50 37 - 106 U/L    Bilirubin, Total 0.2 0.2 - 1.2 mg/dL    Globulin 3.1 2.0 - 3.6 g/dL    Albumin/Globulin Ratio 1.2 0.9 - 2.2    Anion Gap 10.0 5.0 - 15.0   Hemolysis index    Collection Time: 04/07/15  1:30 AM   Result Value Ref Range    Hemolysis Index 10 0 - 18   GFR    Collection Time: 04/07/15  1:30 AM   Result Value Ref Range    EGFR >60.0      IMAGING:  CXR  Left lower lobe pneumonia.    Markers:        EMERGENCY DEPARTMENT COURSE:  Orders Placed This Encounter   Procedures   . Influenza A/B Virus Antigen   . CULTURE BLOOD AEROBIC AND ANAEROBIC   . CULTURE BLOOD AEROBIC AND ANAEROBIC   . Urine culture   . STREP PNEUMONIA, RAPID URINARY ANTIGEN   . Legionella antigen, urine   . Chest AP Portable   . CBC and differential   . Comprehensive Metabolic Panel (CMP)   . Hemolysis index   . GFR   . ECG 12 Lead   . Admit to Inpatient   . Admit to Inpatient   . [facility] ED Bed Request (Inpatient)       ASSESSMENT & PLAN     Wendy Buckley is a 45 y.o. female admitted under [facility] with CAP    Patient Active Hospital Problem List:   CAP (community acquired pneumonia) (04/07/2015)   Left lower pneumonia   Tachypnea   Elevated BP     - blood cultures x2, urine cx ordered  - strep pna, legionella pna urine ag ordered  - rocephin, azithromycin  given in ED, continue   - continue to monitor BPs   - duo-nebs ordered  - consider PFTs as op to r/o vocal cord dys     Nutrition  Regular     DVT/VTE Prophylaxis  Lovenox     [place] Pho    04/07/2015 3:14 AM  Time Elapsed: 45 minutes

## 2015-04-07 NOTE — Progress Notes (Signed)
Patient refused Lovenox. Educated on risks noncompliance and educated on benefits of medication, verbalizes understanding. Charge RN aware, paged Dr. Renold Don to inform. Patient agreeable to SCDs, SCDs on patient.

## 2015-04-07 NOTE — Plan of Care (Signed)
Problem: Hemodynamic Status - Pneumonia  Goal: Vital signs and fluid balance maintained/improved  Respiratory status will be back to baseline.   Outcome: Progressing  The patient's learning abilities have been assessed. Today's individualized plan of care to monitor vital signs, fluid balance, and cough was discussed with the patient and agree to it. Patient demonstrates understanding of disease process, treatment plan, medications and consequences of noncompliance. All questions and concerns were addressed. All vital signs within normal limits, sats 98-99% on RA, SR-ST on telemetry. Patient on IV fluids at 140ml/hr, output adequate. Cough relieved by PRN Robitussin. On IV antibiotics and PO steroids. Will continue to monitor.

## 2015-04-08 LAB — COMPREHENSIVE METABOLIC PANEL
ALT: 7 U/L (ref 0–55)
AST (SGOT): 14 U/L (ref 5–34)
Albumin/Globulin Ratio: 1 (ref 0.9–2.2)
Albumin: 3.1 g/dL — ABNORMAL LOW (ref 3.5–5.0)
Alkaline Phosphatase: 38 U/L (ref 37–106)
Anion Gap: 7 (ref 5.0–15.0)
BUN: 7 mg/dL (ref 7.0–19.0)
Bilirubin, Total: 0.1 mg/dL — ABNORMAL LOW (ref 0.2–1.2)
CO2: 19 mEq/L — ABNORMAL LOW (ref 22–29)
Calcium: 8.4 mg/dL — ABNORMAL LOW (ref 8.5–10.5)
Chloride: 114 mEq/L — ABNORMAL HIGH (ref 100–111)
Creatinine: 0.8 mg/dL (ref 0.6–1.0)
Globulin: 3 g/dL (ref 2.0–3.6)
Glucose: 95 mg/dL (ref 70–100)
Potassium: 4.1 mEq/L (ref 3.5–5.1)
Protein, Total: 6.1 g/dL (ref 6.0–8.3)
Sodium: 140 mEq/L (ref 136–145)

## 2015-04-08 LAB — CBC AND DIFFERENTIAL
Basophils Absolute Automated: 0.02 10*3/uL (ref 0.00–0.20)
Basophils Automated: 0 %
Eosinophils Absolute Automated: 0.01 10*3/uL (ref 0.00–0.70)
Eosinophils Automated: 0 %
Hematocrit: 32.4 % — ABNORMAL LOW (ref 37.0–47.0)
Hgb: 10.1 g/dL — ABNORMAL LOW (ref 12.0–16.0)
Immature Granulocytes Absolute: 0.03 10*3/uL
Immature Granulocytes: 0 %
Lymphocytes Absolute Automated: 2.7 10*3/uL (ref 0.50–4.40)
Lymphocytes Automated: 18 %
MCH: 27.4 pg — ABNORMAL LOW (ref 28.0–32.0)
MCHC: 31.2 g/dL — ABNORMAL LOW (ref 32.0–36.0)
MCV: 87.8 fL (ref 80.0–100.0)
MPV: 9.7 fL (ref 9.4–12.3)
Monocytes Absolute Automated: 0.85 10*3/uL (ref 0.00–1.20)
Monocytes: 6 %
Neutrophils Absolute: 11.59 10*3/uL — ABNORMAL HIGH (ref 1.80–8.10)
Neutrophils: 76 %
Nucleated RBC: 0 /100 WBC (ref 0–1)
Platelets: 243 10*3/uL (ref 140–400)
RBC: 3.69 10*6/uL — ABNORMAL LOW (ref 4.20–5.40)
RDW: 14 % (ref 12–15)
WBC: 15.17 10*3/uL — ABNORMAL HIGH (ref 3.50–10.80)

## 2015-04-08 LAB — ECG 12-LEAD
Atrial Rate: 92 {beats}/min
P Axis: 52 degrees
P-R Interval: 172 ms
Q-T Interval: 358 ms
QRS Duration: 62 ms
QTC Calculation (Bezet): 442 ms
R Axis: 60 degrees
T Axis: 42 degrees
Ventricular Rate: 92 {beats}/min

## 2015-04-08 LAB — GFR: EGFR: >60

## 2015-04-08 LAB — HEMOLYSIS INDEX: Hemolysis Index: 7 (ref 0–18)

## 2015-04-08 MED ORDER — BENZOCAINE 20 % MT SOLN
1.0000 | Freq: Three times a day (TID) | OROMUCOSAL | Status: DC | PRN
Start: 2015-04-08 — End: 2015-04-09
  Administered 2015-04-08: 1 via OROMUCOSAL
  Filled 2015-04-08: qty 57

## 2015-04-08 MED ORDER — PREDNISONE 20 MG PO TABS
20.0000 mg | ORAL_TABLET | Freq: Once | ORAL | Status: AC
Start: 2015-04-08 — End: 2015-04-08
  Administered 2015-04-08: 20 mg via ORAL
  Filled 2015-04-08: qty 1

## 2015-04-08 MED ORDER — PREDNISONE 20 MG PO TABS
60.0000 mg | ORAL_TABLET | Freq: Every morning | ORAL | Status: DC
Start: 2015-04-09 — End: 2015-04-09
  Administered 2015-04-09: 60 mg via ORAL
  Filled 2015-04-08: qty 3

## 2015-04-08 MED ORDER — LACTULOSE 10 GM/15ML PO SOLN
20.0000 g | Freq: Once | ORAL | Status: AC
Start: 2015-04-08 — End: 2015-04-09
  Administered 2015-04-09: 20 g via ORAL
  Filled 2015-04-08: qty 30

## 2015-04-08 NOTE — Progress Notes (Signed)
SW met with pt at bedside.  She reports that she recently relocated here with her brother, Jeri Modena [phone], from [place] where she was with her husband who is in the [employer].  She reports that he was gone a lot so she is going to stay here.  She reports that she has [employer], but doesn't have the card and is unaware of her husband's identifying information to obtain the details.  She will call back with information once she is released from home.  SW will refer to [facility] for follow up. No other needs noted.      She request that her daughter, Quentin Angst [phone], be her HDM.      Lourena Simmonds, MSW  Case Management   Care Coordinator 2  x [phone]       04/08/15 1015   Patient Type   Within 30 Days of Previous Admission? No   Medicare focused diagnosis patient? Not a Medicare focused diagnosis patient   Bundle patient? Not a bundle patient   Healthcare Decisions   Interviewed: Patient   Orientation/Decision Making Abilities of Patient Alert and Oriented x3, able to make decisions   Advance Directive Patient does not have advance directive   Healthcare Agent Appointed Yes   Healthcare Agent's Name Mariane Duval (daughter)   Healthcare Agent's Phone Number [phone]   Prior to admission   Prior level of function Independent with ADLs;Ambulates independently   Type of Residence Private residence   Home Layout One level   Have running water, electricity, heat, etc? Yes   Living Arrangements Family members   How do you get to your MD appointments? Self   How do you get your groceries? Self   Who fixes your meals? Self   Who does your laundry? Self   Who picks up your prescriptions? Self   Dressing Independent   Grooming Independent   Feeding Independent   Bathing Independent   Toileting Independent   Prior SNF admission? (Detail) No   Prior Rehab admission? (Detail) No   Adult Protective Services (APS) involved? No   Discharge Planning   Support Systems Family  members;Children;Spouse/significant other   Patient expects to be discharged to: Home   Anticipated  City plan discussed with: Patient   Follow up appointment scheduled? Yes   Follow up appointment scheduled with: Discharge Clinic   Potential barriers to discharge: No RX coverage   Consults/Providers   PT Evaluation Needed 2   OT Evalulation Needed 2   SLP Evaluation Needed 2   Important Message from Medicare Notice   Patient received 1st IMM Letter? n/a

## 2015-04-08 NOTE — UM Notes (Signed)
NO INS LISTED    04/07/15 0242 Admit to Inpatient (Order [id number])    DX: PNA      46 y.o. female presenting to the ED complaining of an intermittent cough, which began two days ago. Pt notes no hx of asthma and does not smoke. Pt notes associated chest pain, wheezing, and SOB        98.5 F (36.9 C)  Temporal Art  116  100 %  --   32   180/112 mmHg         No past medical history on file.  Hgb 11.4 (L) g/dL        Hematocrit 40.9 (L) %      Platelets 254 x10 3/uL      RBC 4.16 (L) x10 6/uL                     CXR-  Impression:        Left lower lobe pneumonia.                       ER MEDS:   04/07/2015 0115 albuterol (PROVENTIL) nebulizer solution 7.5 mg 7.5 mg Nebulization Given Durenda Age, RT        04/07/2015 0115 ipratropium (ATROVENT) 0.02 % nebulizer solution 0.5 mg 0.5 mg Nebulization Given Durenda Age, RT       04/07/2015 0133 sodium chloride 0.9 % bolus 1,000 mL 1,000 mL Intravenous [address] Chevis Pretty, RN       04/07/2015 0132 methylPREDNISolone sodium succinate (Solu-MEDROL) injection 125 mg 125 mg Intravenous Given Chevis Pretty, RN       04/07/2015 0125 racepinephrine (VAPONEFRIN) 2.25 % nebulizer solution 0.5 mL  Nebulization Canceled Entry Durenda Age, RT       04/07/2015 0227 sodium chloride 0.9 % nebulizer solution 3 mL 3 mL Nebulization Given Durenda Age, RT       04/07/2015 8119 benzonatate (TESSALON) capsule 200 mg 200 mg Oral Given Chevis Pretty, RN       04/07/2015 0215 guaiFENesin-codeine (ROBITUSSIN AC) 100-10 MG/5ML syrup 10 mL 10 mL Oral Given Chevis Pretty, RN       04/07/2015 0216 HYDROcodone-acetaminophen (NORCO) 5-325 MG per tablet 1 tablet 1 tablet Oral Given Chevis Pretty, RN       04/07/2015 0233 cefTRIAXone (ROCEPHIN) 1 g in sodium chloride 0.9 % 100 mL IVPB mini-bag plus 1 g Intravenous [address] Chevis Pretty, RN       04/07/2015 0241 azithromycin (ZITHROMAX) tablet 500 mg 500 mg Oral Given Chevis Pretty, RN        04/07/2015 0226 racepinephrine (VAPONEFRIN) 2.25 % nebulizer solution 0.5 mL  Given Durenda Age, RT       04/07/2015 0251 albuterol (PROVENTIL) nebulizer solution 7.5 mg 7.5 mg Nebulization Given Durenda Age, RT       04/07/2015 0251 ipratropium (ATROVENT) 0.02 % nebulizer solution 0.5 mg 0.5 mg Nebulization Given            ADMIT  DUO NEB Q 4HR  IV AZITHROMYCIN QD  IV ROCEPHIN QD  Prednisone po  ivf 100/hr    Homero Fellers, RN, BSN, ACM, CCM  Utilization Review Nurse  [facility]  [phone] [facility] Staff ONLY)  [phone] (Carriers)    Please submit all clinical review requests via fax to [phone]

## 2015-04-08 NOTE — Plan of Care (Signed)
Problem: Moderate/High Fall Risk Score >5  Goal: Patient will remain free of falls  Outcome: Progressing  Fall risk due to unsteady gait. Assisted with bathroom needs. Instructed to call for help. Verbalized understanding.  Bed alarm on. Hourly  rounding done.   Plan: maintain fall precautions.

## 2015-04-08 NOTE — Plan of Care (Signed)
Problem: Infection/Potential for Infection  Goal: Free from infection  Outcome: Progressing  Afebrile. On IV antibiotics. Urine sample sent for culture. IVF infusing.     Problem: Ineffective Airway Clearance  Goal: Airway is maintained  Intervention: Maintain oxygen level of greater than 92%.  Still coughing an complained chest discomfort when coughs. Cough meds given. Verbalized mild relief. Shortness of breath with exertion. Breathing treatment given by RT. Encouraged  to cough and deep breath. HOB maintained elevated.

## 2015-04-08 NOTE — Plan of Care (Signed)
Problem: Safety  Goal: Patient will be free from injury during hospitalization  Outcome: Progressing  The patient's learning abilities have been assessed. Today's individualized plan of care to respiratory therapy, pain management, throat culture, IV antibiotics,  monitor vitals and labs was discussed with the patient and agree to it. Patient demonstrates understanding of disease process, treatment plan, medications and consequences of noncompliance. All questions and concerns were addressed.           Problem: Pain  Goal: Patient's pain/discomfort is manageable  Outcome: Progressing    Problem: Hemodynamic Status - Pneumonia  Goal: Vital signs and fluid balance maintained/improved  Respiratory status will be back to baseline.   Outcome: Progressing    Problem: Ineffective Airway Clearance  Goal: Airway is maintained  Outcome: Progressing

## 2015-04-08 NOTE — Progress Notes (Signed)
[facility]  PROGRESS NOTE      Patient: Wendy Buckley  Date: 04/08/2015   LOS: 1 Days  Admission Date: 04/07/2015   MRN: [id number]  Attending: Durene Romans Dewanna Hurston    7 AM to 7 PM: Please contact me on the following pager [phone]  After 7 PM: Contact the night hospitalist         ASSESSMENT/PLAN     45 y.o. female with a PMHx of none who presented with cough, chest pain and fevers that started two days ago. Admitted for CAP. Was given racemic epinephrine for concerns of ?stridor.      Plan for the day   Coughing vigorously. Lidocaine spray, cephacol. Tessalon pearls   Continue antibiotic treatment   Follow cultures  Possible [place] in the am. Needs a PCP.      Active Problems:    CAP (community acquired pneumonia)    Hypokalemia    Hyperglycemia    Cough        Pneumonia community acquired  Image: CXR    Plan  - Flu negative  - Strep pneumo ag, legionella urine: negative  - Duonebs and albuterol.   - Blood cultures x 2  - Antibiotics: yes      HypoK  Due to poor po intake?  Replete <4.0    Wheezing.   Given racemic epi for possible stridor which I do not appreciate on exam  Will continue prednisone and assess in the AM.     Cough  Impression: related to pneumonia  Plan:  -     mucinex      Analgesia:  norco    Nutrition: regular    DVT Prophylaxis:enoxaparin subq        Code Status: full    DISPO: home    Family Contact: none       SUBJECTIVE     Wendy Buckley states that her coughing has worsened and that she has severe throat pain. Afebrile HDS.       MEDICATIONS     Current Facility-Administered Medications   Medication Dose Route Frequency   . albuterol-ipratropium  3 mL Nebulization Q4H SCH   . azithromycin  250 mg Intravenous Q24H   . cefTRIAXone  1 g Intravenous Q24H   . enoxaparin  40 mg Subcutaneous Daily   . guaiFENesin  600 mg Oral Q12H SCH   . [START ON 04/09/2015] predniSONE  60 mg Oral QAM W/BREAKFAST   . racepinephrine  0.5 mL Nebulization Once   . traZODone  50 mg Oral QHS       PHYSICAL EXAM     Filed Vitals:     04/08/15 1616   BP: 118/67   Pulse: 100   Temp: 97.8 F (36.6 C)   Resp: 17   SpO2: 99%       Temperature: Temp  Min: 96.7 F (35.9 C)  Max: 98.7 F (37.1 C)  Pulse: Pulse  Min: 89  Max: 105  Respiratory: Resp  Min: 16  Max: 18  Non-Invasive BP: BP  Min: 110/65  Max: 131/70  Pulse Oximetry SpO2  Min: 98 %  Max: 99 %    Intake and Output Summary (Last 24 hours) at Date Time    Intake/Output Summary (Last 24 hours) at 04/08/15 1825  Last data filed at 04/08/15 1209   Gross per 24 hour   Intake 2666.67 ml   Output      0 ml   Net 2666.67 ml  GEN APPEARANCE: Normal;  A&OX3  HEENT: PERLA; EOMI; Conjunctiva Clear  NECK: Supple; No bruits  CVS: RRR, S1, S2; No M/G/R  LUNGS: bilaterally; mild Wheezes; No inspiratory or expiratory wheezing indicative of stridor at the airway I can appreciate.  ABD: Soft; No TTP; + Normoactive BS  EXT: No edema; Pulses 2+ and intact  Skin exam:  pink  NEURO: CN 2-12 intact; No Focal neurological deficits  CAP REFILL:  Normal  MENTAL STATUS:  Normal        LABS       Recent Labs  Lab 04/08/15  1015 04/07/15  0504 04/07/15  0130   WBC 15.17* 9.39 6.84   RBC 3.69* 3.75* 4.16*   HGB 10.1* 10.3* 11.4*   HEMATOCRIT 32.4* 32.9* 36.5*   MCV 87.8 87.7 87.7   PLATELETS 243 235 254         Recent Labs  Lab 04/08/15  1015 04/07/15  0504 04/07/15  0130   SODIUM 140 139 140   POTASSIUM 4.1 3.0* 4.6   CHLORIDE 114* 112* 110   CO2 19* 17* 20*   BUN 7.0 11.0 13.0   CREATININE 0.8 0.8 0.8   GLUCOSE 95 207* 81   CALCIUM 8.4* 7.6* 8.7   MAGNESIUM  --  1.5*  --          Recent Labs  Lab 04/08/15  1015 04/07/15  0504 04/07/15  0130   ALT 7 7 7    AST (SGOT) 14 15 17    BILIRUBIN, TOTAL 0.1* 0.1* 0.2   ALBUMIN 3.1* 3.4* 3.7   ALKALINE PHOSPHATASE 38 41 50                     Microbiology Results     Procedure Component Value Units Date/Time    CULTURE BLOOD AEROBIC AND ANAEROBIC [[id number]] Collected:  04/07/15 0314    Specimen Information:  Blood, Intravenous Line Updated:  04/08/15 1021    Narrative:       ORDER#: [id number]                                    ORDERED BY: Doy Hutching  SOURCE: Blood, Intravenous Line PIV                  COLLECTED:  04/07/15 03:14  ANTIBIOTICS AT COLL.:                                RECEIVED :  04/07/15 09:46  Culture Blood Aerobic and Anaerobic        PRELIM      04/08/15 10:21  04/08/15   No Growth after 1 day/s of incubation.      CULTURE BLOOD AEROBIC AND ANAEROBIC [[id number]] Collected:  04/07/15 0314    Specimen Information:  Blood, Intravenous Line Updated:  04/08/15 1021    Narrative:      ORDER#: [id number]                                    ORDERED BY: Doy Hutching  SOURCE: Blood, Intravenous Line PIV                  COLLECTED:  04/07/15 03:14  ANTIBIOTICS AT COLL.:  RECEIVED :  04/07/15 09:46  Culture Blood Aerobic and Anaerobic        PRELIM      04/08/15 10:21  04/08/15   No Growth after 1 day/s of incubation.      Influenza A/B Virus Antigen [[id number]] Collected:  04/07/15 0130    Specimen Information:  Nasopharyngeal from Nasal Aspirate Updated:  04/07/15 0215    Narrative:      ORDER#: [id number]                                    ORDERED BY: Audley Hose  SOURCE: Nasal Aspirate                               COLLECTED:  04/07/15 01:30  ANTIBIOTICS AT COLL.:                                RECEIVED :  04/07/15 01:47  Influenza Rapid Antigen A&B                FINAL       04/07/15 02:15  04/07/15   Negative for Influenza A and B             Reference Range: Negative      Legionella antigen, urine [[id number]] Collected:  04/07/15 2126    Specimen Information:  Urine from Urine, Clean Catch Updated:  04/08/15 0559    Narrative:      ORDER#: [id number]                                    ORDERED BY: Doy Hutching  SOURCE: Urine, Clean Catch                           COLLECTED:  04/07/15 21:26  ANTIBIOTICS AT COLL.:                                RECEIVED :  04/08/15 00:31  Legionella, Rapid Urinary Antigen          FINAL       04/08/15  05:58  04/08/15   Negative for Legionella pneumophila Serogroup 1 Antigen             Limitations of Test:             1. Negative results do not exclude infection with Legionella                pneumophila Serogroup 1.             2. Does not detect other serogroups of L. pneumophila                or other Legionella species.             Test Reference Range: Negative      STREP PNEUMONIA, RAPID URINARY ANTIGEN [[id number]] Collected:  04/07/15 2125    Specimen Information:  Urine, Clean Catch Updated:  04/08/15 0558    Narrative:      ORDER#: [id number]  ORDERED BY: Doy Hutching  SOURCE: Urine, Clean Catch                           COLLECTED:  04/07/15 21:25  ANTIBIOTICS AT COLL.:                                RECEIVED :  04/08/15 00:31  S. pneumoniae, Rapid Urinary Antigen       FINAL       04/08/15 05:58  04/08/15   Negative for Streptococcus pneumoniae Urinary Antigen             Note:             This is a presumptive test for the direct qualitative             detection of bacterial antigen. This test is not intended as             a substitute for a gram stain and bacterial culture. Samples             with extremely low levels of antigen may yield negative             results.             Reference Range: Negative      Urine culture [[id number]] Collected:  04/07/15 2125    Specimen Information:  Urine from Urine, Clean Catch Updated:  04/08/15 0035            RADIOLOGY     Upon my review:    Madaline Guthrie Reve Crocket  6:25 PM 04/08/2015

## 2015-04-09 ENCOUNTER — Encounter (INDEPENDENT_AMBULATORY_CARE_PROVIDER_SITE_OTHER): Payer: Self-pay

## 2015-04-09 DIAGNOSIS — R062 Wheezing: Secondary | ICD-10-CM

## 2015-04-09 MED ORDER — GUAIFENESIN ER 600 MG PO TB12
600.0000 mg | ORAL_TABLET | Freq: Two times a day (BID) | ORAL | Status: DC
Start: 2015-04-09 — End: 2016-07-14

## 2015-04-09 MED ORDER — PREDNISONE 20 MG PO TABS
40.0000 mg | ORAL_TABLET | Freq: Every morning | ORAL | Status: AC
Start: 2015-04-09 — End: 2015-04-14

## 2015-04-09 MED ORDER — LEVOFLOXACIN 500 MG PO TABS
500.0000 mg | ORAL_TABLET | Freq: Every day | ORAL | Status: AC
Start: 2015-04-09 — End: 2015-04-13

## 2015-04-09 MED ORDER — LEVOFLOXACIN 500 MG PO TABS
500.0000 mg | ORAL_TABLET | Freq: Every day | ORAL | Status: DC
Start: 2015-04-09 — End: 2015-04-09
  Administered 2015-04-09: 500 mg via ORAL
  Filled 2015-04-09: qty 1

## 2015-04-09 MED ORDER — BENZOCAINE-MENTHOL 15-3.6 MG MT LOZG
1.0000 | LOZENGE | OROMUCOSAL | Status: DC | PRN
Start: 2015-04-09 — End: 2016-07-14

## 2015-04-09 MED ORDER — ALBUTEROL SULFATE HFA 108 (90 BASE) MCG/ACT IN AERS
2.0000 | INHALATION_SPRAY | RESPIRATORY_TRACT | Status: DC | PRN
Start: 2015-04-09 — End: 2016-07-14

## 2015-04-09 NOTE — Plan of Care (Signed)
Patient is adequate for discharge per Dr. Renold Don.  Wendy Buckley' brother will pick her up. Patient states she does not have her house key, so her brother is the only option for transportation. He will arrive around 1:30-2pm.

## 2015-04-09 NOTE — Plan of Care (Signed)
Problem: Safety  Goal: Patient will be free from injury during hospitalization  Outcome: Progressing  Patient calls for assistance to get out of bed, safety precautions in place, no falls noted  Intervention: Hourly rounding.  Writer explained to patient about purposefully rounding to check on patient needs, patient needs all met      Problem: Pain  Goal: Patient's pain/discomfort is manageable  Outcome: Progressing  Patient given flexeril for muscle relaxant, tylenol 650 mg for headache with good effect, no other complaint verbalized after administration    Problem: Infection/Potential for Infection  Goal: Free from infection  Patient received antibiotics azithromycin and rocephin IV, no medications adverse effects noted

## 2015-04-09 NOTE — Discharge Instructions (Signed)
Dear. Ms. Wendy Buckley,     Thank you for allowing me to care for you during your stay in the hospital. You were admitted for acute respiratory distress due to L lobe pneumonia for which you were started antibiotics and steroids. I am continuing your antibiotic for 4 more days. Make sure to also follow up with your primary care doctor for follow-up.    New medications: prednisone (inflammation), levofloxacin (antibiotic), mucinex (cough medication)    Finally, as your discharging physician, you may be receiving a survey which is regarding my care. I would greatly value and appreciate your input in the survey.  Many thanks and wish you a speedy recovery,  [name], MD        Pneumonia (Adult)  Pneumonia is an infection deep within the lungs. It is in the small air sacs (alveoli). Pneumonia may be caused by a virus or bacteria. Pneumonia caused by bacteriais usually treated with an antibiotic. Severe cases may need to be treated in the hospital. Milder cases can be treated at home. Symptoms usually start to get better during the first2 days of treatment.    Home care  Follow these guidelines when caring for yourself at home:   Rest at home for the first 2 to 3 days, or until you feel stronger. Don't let yourself get overly tired when you go back to your activities.   Stay away from cigarette smoke - yours or other people's.   You may use acetaminophen or ibuprofen to control fever or pain, unless another medicine was prescribed. If you have chronic liver or kidney disease, talk with your health care provider before using these medicines. Also talk with your provider if you've had a stomach ulcer or GI bleeding. Don't give aspirin to anyone younger than 43 years of age who is ill with a fever. It may cause severe liver damage.   Your appetite may be poor, so a light diet is fine.   Drink 6 to 8 glasses of fluids every day to make sure you are getting enough fluids. Beverages can include water, sport drinks, sodas  without caffeine, juices, tea, or soup. Fluids will help loosen secretions in the lung. This will make it easier for you to cough up the phlegm (sputum). If you also have heart or kidney disease, check with your health care provider before you drink extra fluids.   Take antibiotic medicine prescribed until it is all gone, even if you are feeling better after a few days.  Follow-up care  Follow up with your health care provider in the next 2 to 3 days, or as advised. This is to be sure the medicine is helping you get better.  If you are 105 or older, you should get a pneumococcal vaccine and a yearlyflu (influenza)shot. You should also get these vaccines if you have chronic lung disease like asthma, emphysema, or COPD. Ask your provider about this.  When to seek medical advice  Call your health care provider right away if any of these occur:   You don't get better within the first 48 hours of treatment   Shortness of breath gets worse   Rapid breathing (more than 25 breaths per minute)   Coughing up blood   Chest pain gets worse with breathing   Fever of102F (38C) or higher that doesn't get better with fever medicine   Weakness, dizziness, or fainting that gets worse   Thirst or dry mouth that gets worse   Sinus pain, headache,  or a stiff neck   Chest pain not caused by coughing  Date Last Reviewed: 12/27/2012   2000-2016 [employer] [address] [place], [place] [address]. All rights reserved. This information is not intended as a substitute for professional medical care. Always follow your healthcare professional's instructions.          Preventing Pneumonia  Pneumonia is an infection in one or both of the lungs. Those most at risk include the elderly, smokers, people with chronic lung diseases. For example, those with conditions like chronic obstructive pulmonary disease (COPD), includingemphysema or asthma,and those with weak immune systems. There are some things you can do to  lessen the chance that you will get pneumonia.    Avoid infection   Wash your hands often   Use soap and warm water.   If soap and water aren't available, use a hand cleaner with alcohol in it.   Avoid touching your face and mouth with your hands.   Use disposable tissues instead of a handkerchief. Throw used tissues away.   Avoid people who have a cold or the flu.   Try to avoid crowded places.  Get vaccinated  Talk with your health care provider about vaccinations. You should get a yearly flu shot and at least one pneumococcal shot.   Get a flu vaccine every year as soon as it's available in your area. The flu shot helps prevent you from getting the flu and complications of the flu, such as pneumonia.   Get pneumococcal pneumonia vaccines. Talk with your health care provider about which pneumococcal vaccines are right for you.  Do suggested breathing exercises  Deep breathing and coughing exercises can help clear your lungs. Your health care provider may suggest them. If so, you will be shown how to do them. Do them as often as your health care provider instructs.  Take care of your body   Drink at least 6 to 8 glasses ofwater a day.   Eat well-balanced, nutritious meals.   Avoid drinking alcohol.   Don't smoke. Avoid places where people are smoking.   Moving around helps keep your lungs clear. Ask your health care provider what type of activity is best for you. Walking is often a good choice.   Get enough rest. Sleep at least 8 hours each night. Rest or nap during the day as needed.  Date Last Reviewed: 07/29/2012   2000-2016 [employer] [address] [place], [place] [address]. All rights reserved. This information is not intended as a substitute for professional medical care. Always follow your healthcare professional's instructions.        Prednisone tablets  What is this medicine?  PREDNISONE (PRED ni sone) is a corticosteroid. It is commonly used to treat inflammation of the  skin, joints, lungs, and other organs. Common conditions treated include asthma, allergies, and arthritis. It is also used for other conditions, such as blood disorders and diseases of the adrenal glands.  How should I use this medicine?  Take this medicine by mouth with a glass of water. Follow the directions on the prescription label. Take this medicine with food. If you are taking this medicine once a day, take it in the morning. Do not take more medicine than you are told to take. Do not suddenly stop taking your medicine because you may develop a severe reaction. Your doctor will tell you how much medicine to take. If your doctor wants you to stop the medicine,  the dose may be slowly lowered over time to avoid any side effects.  Talk to your pediatrician regarding the use of this medicine in children. Special care may be needed.  What side effects may I notice from receiving this medicine?  Side effects that you should report to your doctor or health care professional as soon as possible:   allergic reactions like skin rash, itching or hives, swelling of the face, lips, or tongue   changes in emotions or moods   changes in vision   depressed mood   eye pain   fever or chills, cough, sore throat, pain or difficulty passing urine   increased thirst   swelling of ankles, feet  Side effects that usually do not require medical attention (report to your doctor or health care professional if they continue or are bothersome):   confusion, excitement, restlessness   headache   nausea, vomiting   skin problems, acne, thin and shiny skin   trouble sleeping   weight gain  What may interact with this medicine?  Do not take this medicine with any of the following medications:   metyrapone   mifepristone  This medicine may also interact with the following medications:   aminoglutethimide   amphotericin B   aspirin and aspirin-like medicines   barbiturates   certain medicines for diabetes, like glipizide  or glyburide   cholestyramine   cholinesterase inhibitors   cyclosporine   digoxin   diuretics   ephedrine   female hormones, like estrogens and birth control pills   isoniazid   ketoconazole   NSAIDS, medicines for pain and inflammation, like ibuprofen or naproxen   phenytoin   rifampin   toxoids   vaccines   warfarin  What if I miss a dose?  If you miss a dose, take it as soon as you can. If it is almost time for your next dose, talk to your doctor or health care professional. You may need to miss a dose or take an extra dose. Do not take double or extra doses without advice.  Where should I keep my medicine?  Keep out of the reach of children.  Store at room temperature between 15 and 30 degrees C (59 and 86 degrees F). Protect from light. Keep container tightly closed. Throw away any unused medicine after the expiration date.  What should I tell my health care provider before I take this medicine?  They need to know if you have any of these conditions:   Cushing's syndrome   diabetes   glaucoma   heart disease   high blood pressure   infection (especially a virus infection such as chickenpox, cold sores, or herpes)   kidney disease   liver disease   mental illness   myasthenia gravis   osteoporosis   seizures   stomach or intestine problems   thyroid disease   an unusual or allergic reaction to lactose, prednisone, other medicines, foods, dyes, or preservatives   pregnant or trying to get pregnant   breast-feeding  What should I watch for while using this medicine?  Visit your doctor or health care professional for regular checks on your progress. If you are taking this medicine over a prolonged period, carry an identification card with your name and address, the type and dose of your medicine, and your doctor's name and address.  This medicine may increase your risk of getting an infection. Tell your doctor or health care professional if you are around anyone with  measles or  chickenpox, or if you develop sores or blisters that do not heal properly.  If you are going to have surgery, tell your doctor or health care professional that you have taken this medicine within the last twelve months.  Ask your doctor or health care professional about your diet. You may need to lower the amount of salt you eat.  This medicine may affect blood sugar levels. If you have diabetes, check with your doctor or health care professional before you change your diet or the dose of your diabetic medicine.  Date Last Reviewed:   NOTE:This sheet is a summary. It may not cover all possible information. If you have questions about this medicine, talk to your doctor, pharmacist, or health care provider. Copyright 2016 [employer] Standard      Levofloxacin Oral tablet  What is this medicine?  LEVOFLOXACIN (lee voe FLOX a sin) is a quinolone antibiotic. It is used to treat certain kinds of bacterial infections. It will not work for colds, flu, or other viral infections.  This medicine may be used for other purposes; ask your health care provider or pharmacist if you have questions.  What should I tell my health care provider before I take this medicine?  They need to know if you have any of these conditions:   cerebral disease   irregular heartbeat   kidney disease   seizure disorder   an unusual or allergic reaction to levofloxacin, other antibiotics or medicines, foods, dyes, or preservatives   pregnant or trying to get pregnant   breast-feeding  How should I use this medicine?  Take this medicine by mouth with a full glass of water. Follow the directions on the prescription label. This medicine can be taken with or without food. Take your medicine at regular intervals. Do not take your medicine more often than directed. Do not skip doses or stop your medicine early even if you feel better. Do not stop taking except on your doctor's advice.  A special MedGuide will be given to you by the pharmacist with each  prescription and refill. Be sure to read this information carefully each time.  Talk to your pediatrician regarding the use of this medicine in children. While this drug may be prescribed for children as young as 6 months for selected conditions, precautions do apply.  Overdosage: If you think you have taken too much of this medicine contact a poison control center or emergency room at once.  NOTE: This medicine is only for you. Do not share this medicine with others.  What if I miss a dose?  If you miss a dose, take it as soon as you remember. If it is almost time for your next dose, take only that dose. Do not take double or extra doses.  What may interact with this medicine?  Do not take this medicine with any of the following medications:   arsenic trioxide   chloroquine   droperidol   medicines for irregular heart rhythm like amiodarone, disopyramide, dofetilide, flecainide, quinidine, procainamide, sotalol   some medicines for depression or mental problems like phenothiazines, pimozide, and ziprasidone  This medicine may also interact with the following medications:   amoxapine   antacids   cisapride   dairy products   didanosine (ddI) buffered tablets or powder   haloperidol   multivitamins   NSAIDS, medicines for pain and inflammation, like ibuprofen or naproxen   retinoid products like tretinoin or isotretinoin   risperidone   some  other antibiotics like clarithromycin or erythromycin   sucralfate   theophylline   warfarin  This list may not describe all possible interactions. Give your health care provider a list of all the medicines, herbs, non-prescription drugs, or dietary supplements you use. Also tell them if you smoke, drink alcohol, or use illegal drugs. Some items may interact with your medicine.  What should I watch for while using this medicine?  Tell your doctor or health care professional if your symptoms do not improve or if they get worse. Drink several glasses of water a  day and cut down on drinks that contain caffeine. You must not get dehydrated while taking this medicine.  You may get drowsy or dizzy. Do not drive, use machinery, or do anything that needs mental alertness until you know how this medicine affects you. Do not sit or stand up quickly, especially if you are an older patient. This reduces the risk of dizzy or fainting spells.  This medicine can make you more sensitive to the sun. Keep out of the sun. If you cannot avoid being in the sun, wear protective clothing and use a sunscreen. Do not use sun lamps or tanning beds/booths. Contact your doctor if you get a sunburn.  If you are a diabetic monitor your blood glucose carefully. If you get an unusual reading stop taking this medicine and call your doctor right away.  Do not treat diarrhea with over-the-counter products. Contact your doctor if you have diarrhea that lasts more than 2 days or if the diarrhea is severe and watery.  Avoid antacids, calcium, iron, and zinc products for 2 hours before and 2 hours after taking a dose of this medicine.  What side effects may I notice from receiving this medicine?  Side effects that you should report to your doctor or health care professional as soon as possible:   allergic reactions like skin rash or hives, swelling of the face, lips, or tongue   changes in vision   confusion, nightmares or hallucinations   difficulty breathing   irregular heartbeat, chest pain   joint, muscle or tendon pain   pain or difficulty passing urine   persistent headache with or without blurred vision   redness, blistering, peeling or loosening of the skin, including inside the mouth   seizures   unusual pain, numbness, tingling, or weakness   vaginal irritation, discharge  Side effects that usually do not require medical attention (report to your doctor or health care professional if they continue or are bothersome):   diarrhea   dry mouth   headache   stomach upset,  nausea   trouble sleeping  This list may not describe all possible side effects. Call your doctor for medical advice about side effects. You may report side effects to FDA at 1-800-FDA-1088.  Where should I keep my medicine?  Keep out of the reach of children.  Store at room temperature between 15 and 30 degrees C (59 and 86 degrees F). Keep in a tightly closed container. Throw away any unused medicine after the expiration date.  NOTE:This sheet is a summary. It may not cover all possible information. If you have questions about this medicine, talk to your doctor, pharmacist, or health care provider. Copyright 2015 Gold Standard

## 2015-04-09 NOTE — Plan of Care (Signed)
Problem: Infection/Potential for Infection  Goal: Free from infection  Outcome: Completed Date Met:  04/09/15  The patient learning abilities have been assessed.     Today's individualized plan of care to  - PO ABX  - PO Steriods  - Scheduled NEB treatments  - Discharge today     Patient demonstrates understanding of disease process, treatment plan, medications and consequences of noncompliance. All questions and concerns were addressed.

## 2015-04-09 NOTE — Progress Notes (Addendum)
SW met with pt at bedside. Reminded her of [facility] apt scheduled and to forward her insurance information to the hospital Financial Department to avoid the entire bill.  Pt reports that family will transport her home at [facility].  SW offered the [employer], but pt does not want to ride with others.  SW offered cab voucher and she declined stating that she doesn't have keys.  No needs from CM.  SW provided her with a letter for [facility] apt with details.    Lourena Simmonds, MSW  Case Management   Care Coordinator [phone]  x [phone]       04/09/15 1029   Discharge Disposition   Patient preference/choice provided? N/A   Physical Discharge Disposition Home, No Needs   Mode of Transportation Car   Patient/Family/POA notified of transfer plan Yes;Patient informed only   Patient agreeable to discharge plan/expected d/c date? Yes   Family/POA agreeable to discharge plan/expected d/c date? Yes   Bedside nurse notified of transport plan? Yes   CM Interventions   Follow up appointment scheduled? Yes   Follow up appointment scheduled with: Discharge Clinic   Multidisciplinary rounds/family meeting before d/c? Yes   Medicare Checklist   Is this a Medicare patient? No   Patient received 1st IMM Letter? n/a

## 2015-04-09 NOTE — Progress Notes (Signed)
Patient is discharged per Dr. Orlando Penner order. Vital signs were stable and NSR on the telemetry monitor. PIV line was taken out, Telemetry box was discontinued. Patient's personal belongings were gathered and packed.  Patient education was conducted regarding the discharge instructions, new prescriptions and follow up appointments. Patient and family members doesn't have any further questions for the treatment team and they expressed appreciation for the excellent service that they received during this hospitalization.  All questions/concerns addressed.

## 2015-04-09 NOTE — Progress Notes (Signed)
 [facility] ([facility])    Received a referral to schedule a follow up appointment with the [facility].  Appointment scheduled for Wednesday 04/17/15 at 9:30am at the [facility] location.    Clinic address is as follows:    [address] [place], [address]      Please notify patient to arrive 15 minutes early to the appointment and bring the following materials with them:    Insurance card (if insured) and photo ID  Medications in their original bottles  Glucometer/blood sugar log (if diabetic)  Weight log (if heart failure)  Proof of income (to enroll in medication assistance programs-first two pages of signed 1040 tax forms or last 2 months of pay stubs)      Terressa Koyanagi  Transitional Services   Sched Reg Rep II  T [phone]  F [phone]

## 2015-04-09 NOTE — Discharge Summary (Signed)
[facility]      Patient: Wendy Buckley  Admission Date: 04/07/2015   DOB: [birth year]  Discharge Date: 04/09/2015    MRN: [id number]  Discharge Attending:Harlie Ragle  M Day Greb     Referring Physician: [name], MD  PCP: [name], MD       DISCHARGE SUMMARY     Discharge Information   Admission Diagnosis:   CAP (community acquired pneumonia)    Discharge Diagnosis:   Active Hospital Problems    Diagnosis   . Wheezing   . CAP (community acquired pneumonia)   . Hypokalemia   . Hyperglycemia   . Cough        Admission Condition: stable  Discharge Condition: stable  Consultants: none  Functional Status: as tolerated  Discharged to: home    Discharge Medications:     Medication List      START taking these medications          albuterol 108 (90 Base) MCG/ACT inhaler   Commonly known as:  PROVENTIL HFA;VENTOLIN HFA   Inhale 2 puffs into the lungs every 4 (four) hours as needed for Wheezing.       benzocaine-menthol 15-3.6 MG Lozg lozenge   Commonly known as:  CEPACOL   Place 1 lozenge inside cheek every hour as needed.       guaiFENesin 600 MG 12 hr tablet   Commonly known as:  MUCINEX   Take 1 tablet (600 mg total) by mouth every 12 (twelve) hours.       levoFLOXacin 500 MG tablet   Commonly known as:  LEVAQUIN   Take 1 tablet (500 mg total) by mouth daily.       predniSONE 20 MG tablet   Commonly known as:  DELTASONE   Take 2 tablets (40 mg total) by mouth every morning with breakfast.         CONTINUE taking these medications          ferrous sulfate 325 (65 FE) MG tablet            Where to Get Your Medications      You can get these medications from any pharmacy     Bring a paper prescription for each of these medications    - albuterol 108 (90 Base) MCG/ACT inhaler  - benzocaine-menthol 15-3.6 MG Lozg lozenge  - guaiFENesin 600 MG 12 hr tablet  - levoFLOXacin 500 MG tablet  - predniSONE 20 MG tablet              Hospital Course   Presentation History     45 y.o. female with a PMHx of none who presented  with cough, chest pain and fevers that started two days ago. Admitted for CAP. Was given racemic epinephrine for concerns of ?stridor.    See HPI for details.    Hospital Course (2 Days)     Started on IV ceftriaxone and azithromycin. Blood cultures remained negative. Strep pneumoniae and legionella were negative. She was discharged on steroids given persistent wheezing, cough medication and 4 more days of levaquin to complete a 7 day course of antibiotics for pneumonia. Instructed to follow up with a PCP.     ew medications: prednisone (inflammation), levofloxacin (antibiotic), mucinex (cough medication)      Pneumonia community acquired  Image: CXR    Plan  -Flu negative  -Strep pneumo ag, legionella urine: negative  -Duonebs and albuterol.   -Blood cultures x 2  -Antibiotics: yes  HypoK - resolved  Due to poor po intake?  Replete <4.0    Wheezing - improved  Given racemic epi for possible stridor which I do not appreciate on exam  Will continue prednisone    Cough  Impression: related to pneumonia  Plan:  - mucinex  Procedures/Imaging:   Upon my review:   Chest Ap Portable    04/07/2015  Left lower lobe pneumonia. Adaline Sill, MD 04/07/2015 2:13 AM            Best Practices   Was the patient admitted with either a CHF Exacerbation or Pneumonia? y     Progress Note/Physical Exam at Discharge     Subjective: +cough, wheezing. Ready to go home. Afebrile HDS on RA saturating high 90s%    Filed Vitals:    04/08/15 2330 04/09/15 0338 04/09/15 0819 04/09/15 1157   BP: 117/72  119/73 124/75   Pulse: 95  65 99   Temp: 97.8 F (36.6 C)  97.8 F (36.6 C) 98.1 F (36.7 C)   TempSrc: Oral   Oral   Resp: 16  16 18    Height:       Weight:  64.728 kg (142 lb 11.2 oz)     SpO2: 99%  93% 97%           General: NAD, AAOx3  HEENT: perrla, eomi, sclera anicteric, OP: Clear, MMM  Neck: supple, FROM, no LAD  Cardiovascular: RRR, no m/r/g  Lungs: bilateral wheezing  persistent.   Abdomen: soft, +BS, NT/ND, no masses, no g/r  Extremities: no C/C/E  Skin: no rashes or lesions noted  Neuro: CN 2-12 intact; No Focal neurological deficits       Diagnostics     Labs/Studies Pending at Discharge: No    Last Labs     Recent Labs  Lab 04/08/15  1015 04/07/15  0504 04/07/15  0130   WBC 15.17* 9.39 6.84   RBC 3.69* 3.75* 4.16*   HGB 10.1* 10.3* 11.4*   HEMATOCRIT 32.4* 32.9* 36.5*   MCV 87.8 87.7 87.7   PLATELETS 243 235 254         Recent Labs  Lab 04/08/15  1015 04/07/15  0504 04/07/15  0130   SODIUM 140 139 140   POTASSIUM 4.1 3.0* 4.6   CHLORIDE 114* 112* 110   CO2 19* 17* 20*   BUN 7.0 11.0 13.0   CREATININE 0.8 0.8 0.8   GLUCOSE 95 207* 81   CALCIUM 8.4* 7.6* 8.7   MAGNESIUM  --  1.5*  --              Microbiology Results     Procedure Component Value Units Date/Time    CULTURE BLOOD AEROBIC AND ANAEROBIC [[id number]] Collected:  04/07/15 0314    Specimen Information:  Blood, Intravenous Line Updated:  04/09/15 1021    Narrative:      ORDER#: [id number]                                    ORDERED BY: Doy Hutching  SOURCE: Blood, Intravenous Line PIV                  COLLECTED:  04/07/15 03:14  ANTIBIOTICS AT COLL.:  RECEIVED :  04/07/15 09:46  Culture Blood Aerobic and Anaerobic        PRELIM      04/09/15 10:21  04/08/15   No Growth after 1 day/s of incubation.  04/09/15   No Growth after 2 day/s of incubation.      CULTURE BLOOD AEROBIC AND ANAEROBIC [[id number]] Collected:  04/07/15 0314    Specimen Information:  Blood, Intravenous Line Updated:  04/09/15 1021    Narrative:      ORDER#: [id number]                                    ORDERED BY: Doy Hutching  SOURCE: Blood, Intravenous Line PIV                  COLLECTED:  04/07/15 03:14  ANTIBIOTICS AT COLL.:                                RECEIVED :  04/07/15 09:46  Culture Blood Aerobic and Anaerobic        PRELIM      04/09/15 10:21  04/08/15   No Growth after 1 day/s of incubation.  04/09/15   No  Growth after 2 day/s of incubation.      Influenza A/B Virus Antigen [[id number]] Collected:  04/07/15 0130    Specimen Information:  Nasopharyngeal from Nasal Aspirate Updated:  04/07/15 0215    Narrative:      ORDER#: [id number]                                    ORDERED BY: Audley Hose  SOURCE: Nasal Aspirate                               COLLECTED:  04/07/15 01:30  ANTIBIOTICS AT COLL.:                                RECEIVED :  04/07/15 01:47  Influenza Rapid Antigen A&B                FINAL       04/07/15 02:15  04/07/15   Negative for Influenza A and B             Reference Range: Negative      Legionella antigen, urine [[id number]] Collected:  04/07/15 2126    Specimen Information:  Urine from Urine, Clean Catch Updated:  04/08/15 0559    Narrative:      ORDER#: [id number]                                    ORDERED BY: Doy Hutching  SOURCE: Urine, Clean Catch                           COLLECTED:  04/07/15 21:26  ANTIBIOTICS AT COLL.:                                RECEIVED :  04/08/15 00:31  Legionella, Rapid Urinary Antigen          FINAL       04/08/15 05:58  04/08/15   Negative for Legionella pneumophila Serogroup 1 Antigen             Limitations of Test:             1. Negative results do not exclude infection with Legionella                pneumophila Serogroup 1.             2. Does not detect other serogroups of L. pneumophila                or other Legionella species.             Test Reference Range: Negative      STREP PNEUMONIA, RAPID URINARY ANTIGEN [[id number]] Collected:  04/07/15 2125    Specimen Information:  Urine, Clean Catch Updated:  04/08/15 0558    Narrative:      ORDER#: [id number]                                    ORDERED BY: Doy Hutching  SOURCE: Urine, Clean Catch                           COLLECTED:  04/07/15 21:25  ANTIBIOTICS AT COLL.:                                RECEIVED :  04/08/15 00:31  S. pneumoniae, Rapid Urinary Antigen       FINAL       04/08/15 05:58  04/08/15    Negative for Streptococcus pneumoniae Urinary Antigen             Note:             This is a presumptive test for the direct qualitative             detection of bacterial antigen. This test is not intended as             a substitute for a gram stain and bacterial culture. Samples             with extremely low levels of antigen may yield negative             results.             Reference Range: Negative      Throat Culture [[id number]] Collected:  04/09/15 0030    Specimen Information:  Throat from Throat Updated:  04/09/15 0657    Urine culture [[id number]] Collected:  04/07/15 2125    Specimen Information:  Urine from Urine, Clean Catch Updated:  04/08/15 2241    Narrative:      ORDER#: [id number]                                    ORDERED BY: Doy Hutching  SOURCE: Urine, Clean Catch                           COLLECTED:  04/07/15 21:25  ANTIBIOTICS AT COLL.:  RECEIVED :  04/08/15 00:35  Culture Urine                              FINAL       04/08/15 22:41  04/08/15   No growth of >1,000 CFU/ML, No further work             Patient Instructions   Discharge Diet: regular  Discharge Activity: as tolerated    Follow Up Appointment:  Follow-up Information     Follow up with Pcp, Noneorunknown, MD .           Time spent examining patient, discussing with patient/family regarding hospital course, chart review, reconciling medications and discharge planning: 60 minutes.    Patrcia Dolly    4:49 PM 04/09/2015

## 2015-04-17 ENCOUNTER — Ambulatory Visit (INDEPENDENT_AMBULATORY_CARE_PROVIDER_SITE_OTHER): Payer: Self-pay | Admitting: Nurse Practitioner

## 2015-04-25 ENCOUNTER — Ambulatory Visit (INDEPENDENT_AMBULATORY_CARE_PROVIDER_SITE_OTHER): Payer: Self-pay | Admitting: Nurse Practitioner

## 2016-06-26 ENCOUNTER — Emergency Department
Admission: EM | Admit: 2016-06-26 | Discharge: 2016-06-26 | Disposition: A | Discharge status: Home / Self Care | Payer: Self-pay | Attending: Emergency Medicine | Admitting: Emergency Medicine

## 2016-06-26 DIAGNOSIS — L299 Pruritus, unspecified: Secondary | ICD-10-CM | POA: Insufficient documentation

## 2016-06-26 DIAGNOSIS — R102 Pelvic and perineal pain: Secondary | ICD-10-CM | POA: Insufficient documentation

## 2016-06-26 DIAGNOSIS — N390 Urinary tract infection, site not specified: Secondary | ICD-10-CM | POA: Insufficient documentation

## 2016-06-26 DIAGNOSIS — N898 Other specified noninflammatory disorders of vagina: Secondary | ICD-10-CM | POA: Insufficient documentation

## 2016-06-26 LAB — URINALYSIS, REFLEX TO MICROSCOPIC EXAM IF INDICATED
Bilirubin, UA: NEGATIVE
Blood, UA: NEGATIVE
Glucose, UA: NEGATIVE
Ketones UA: NEGATIVE
Nitrite, UA: NEGATIVE
Protein, UR: NEGATIVE
Specific Gravity UA: 1.017 (ref 1.001–1.035)
Urine pH: 6 (ref 5.0–8.0)
Urobilinogen, UA: NEGATIVE mg/dL

## 2016-06-26 LAB — URINE BHCG POC: Urine bHCG POC: NEGATIVE

## 2016-06-26 MED ORDER — CEPHALEXIN 500 MG PO CAPS
500.0000 mg | ORAL_CAPSULE | Freq: Once | ORAL | Status: AC
Start: 2016-06-26 — End: 2016-06-26
  Administered 2016-06-26: 500 mg via ORAL
  Filled 2016-06-26: qty 1

## 2016-06-26 MED ORDER — CEPHALEXIN 500 MG PO CAPS: 500.0000 mg | ORAL_CAPSULE | Freq: Two times a day (BID) | ORAL | 0 refills | Status: AC

## 2016-06-26 MED ORDER — NITROFURANTOIN MONOHYD MACRO 100 MG PO CAPS
100.0000 mg | ORAL_CAPSULE | Freq: Two times a day (BID) | ORAL | Status: DC
Start: 2016-06-26 — End: 2016-06-26

## 2016-06-26 MED ORDER — PHENAZOPYRIDINE HCL 100 MG PO TABS
200.0000 mg | ORAL_TABLET | Freq: Once | ORAL | Status: AC
Start: 2016-06-26 — End: 2016-06-26
  Administered 2016-06-26: 200 mg via ORAL
  Filled 2016-06-26: qty 2

## 2016-06-26 MED ORDER — HYDROCODONE-ACETAMINOPHEN 5-325 MG PO TABS
1.0000 | ORAL_TABLET | Freq: Four times a day (QID) | ORAL | 0 refills | Status: DC | PRN
Start: 2016-06-26 — End: 2016-07-14

## 2016-06-26 MED ORDER — PHENAZOPYRIDINE HCL 200 MG PO TABS
200.0000 mg | ORAL_TABLET | Freq: Three times a day (TID) | ORAL | 0 refills | Status: DC | PRN
Start: 2016-06-26 — End: 2016-07-14

## 2016-06-26 NOTE — Discharge Instructions (Signed)
Urinary Tract Infection     You have been diagnosed with a lower urinary tract infection (UTI). This is also called cystitis.     Cystitis is an infection in your bladder. Your doctor diagnosed it by testing your urine. Cystitis usually causes burning with urination or frequent urination. It might make you feel like you have to urinate even when you don’t.      Cystitis is usually treated with antibiotics and medicine to help with pain.     It is VERY IMPORTANT that you fill your prescription and take all of the antibiotics as directed. If a lower urinary tract infection goes untreated for too long, it can become a kidney infection.     FOR WOMEN: To reduce the risk of getting cystitis again:  · Always urinate before and after sexual intercourse.  · Always wipe from front to back after urinating or having a bowel movement. Do not wipe from back to front.  · Drink plenty of fluids. Try to drink cranberry or blueberry juice. These juices have a chemical that stops bacteria from “sticking” to the bladder.     YOU SHOULD SEEK MEDICAL ATTENTION IMMEDIATELY, EITHER HERE OR AT THE NEAREST EMERGENCY DEPARTMENT, IF ANY OF THE FOLLOWING OCCURS:  · You have a fever (temperature higher than 100.4ºF / 38ºC) or shaking chills.  · You feel nauseated or vomit.  · You have pain in your side or back.  · You don’t get better after taking all of your antibiotics.  · You have any new symptoms or concerns.  · You feel worse or do not improve.           Bartholins Gland Abscess     You have been diagnosed with an infected Bartholin s gland.     These are normal glands located on the vaginal wall. They secrete lubricating fluid in the vagina. If the opening is blocked, mucous may get infected. The side of the vaginal opening gets red, swollen and painful.     The infection is treated by draining the abscess. A small catheter is placed for up to 6 weeks. You may also need pain medicine and antibiotics.     Follow-up with your gynecologist  is important. Follow up within 1-2 weeks.     YOU SHOULD SEEK MEDICAL ATTENTION IMMEDIATELY, EITHER HERE OR AT THE NEAREST EMERGENCY DEPARTMENT, IF ANY OF THE FOLLOWING OCCURS:  · More pain, redness of the surrounding area or fever (temperature higher than 100.4ºF / 38ºC).  · Symptoms worsen instead of getting better with treatment.  · For any other new symptoms or concerns.

## 2016-06-26 NOTE — ED Provider Notes (Signed)
EMERGENCY DEPARTMENT HISTORY AND PHYSICAL EXAM     Physician/Midlevel provider first contact with patient: 06/26/16 1203         Date: 06/26/2016  Patient Name: Wendy Buckley  Attending Physician: Westley Foots, MD  Advanced Practice Provider: Arnoldo Morale    History of Presenting Illness       History Provided By: Patient    Chief Complaint:  Chief Complaint   Patient presents with   . Urinary Tract Infection Symptoms       HPI: Wendy Buckley is a 46 y.o. female presenting to the ED with 3 days vaginal itching, burning, with yellow malodorous vaginal discharge. She finished her cycle 3 days ago, usually uses sensitive pads but didn't have any this time so she thinks it caused her to break out and now she has a "ball of swelling" down there.     PCP: Pcp, Noneorunknown, MD  SPECIALISTS:    No current facility-administered medications for this encounter.      Current Outpatient Prescriptions   Medication Sig Dispense Refill   . albuterol (PROVENTIL HFA;VENTOLIN HFA) 108 (90 Base) MCG/ACT inhaler Inhale 2 puffs into the lungs every 4 (four) hours as needed for Wheezing. 1 Inhaler 0   . benzocaine-menthol (CEPACOL) 15-3.6 MG Lozenge lozenge Place 1 lozenge inside cheek every hour as needed. 30 each 0   . cephalexin (KEFLEX) 500 MG capsule Take 1 capsule (500 mg total) by mouth 2 (two) times daily.for 5 days Take 1 capsule by mouth 2 (two) times daily for 5 days. 10 capsule 0   . ferrous sulfate 325 (65 FE) MG tablet Take 325 mg by mouth 3 (three) times daily with meals.     Marland Kitchen guaiFENesin (MUCINEX) 600 MG 12 hr tablet Take 1 tablet (600 mg total) by mouth every 12 (twelve) hours. 30 tablet 0   . HYDROcodone-acetaminophen (NORCO) 5-325 MG per tablet Take 1 tablet by mouth every 6 (six) hours as needed for Pain.for up to 15 doses 15 tablet 0   . phenazopyridine (PYRIDIUM) 200 MG tablet Take 1 tablet (200 mg total) by mouth 3 (three) times daily as needed (for pain or burning during urination).for up to 6  doses 10 tablet 0       Past History     Past Medical History:  History reviewed. No pertinent past medical history.    Past Surgical History:  History reviewed. No pertinent surgical history.    Family History:  Family History   Problem Relation Age of Onset   . Ovarian cancer Mother        Social History:  Social History     Social History   . Marital status: Married     Spouse name: N/A   . Number of children: N/A   . Years of education: N/A     Social History Main Topics   . Smoking status: Never Smoker   . Smokeless tobacco: Never Used   . Alcohol use No   . Drug use: No   . Sexual activity: Not Asked     Other Topics Concern   . None     Social History Narrative    Works at [employer] at home with husband        Allergies:  No Known Allergies    Review of Systems     Review of Systems   Constitutional: Negative for chills and fever.   Gastrointestinal: Negative for abdominal pain, constipation,  diarrhea, nausea and vomiting.   Genitourinary: Positive for dysuria. Negative for flank pain, frequency, hematuria and urgency.   Skin: Positive for itching and rash.        "breaking out" in vaginal area       Physical Exam     Vitals:    06/26/16 1128   BP: 112/72   Pulse: 95   Resp: 16   Temp: 97.5 F (36.4 C)   TempSrc: Oral   SpO2: 100%   Weight: 59 kg   Height: 5\' 5"  (1.651 m)       Physical Exam   Constitutional: She is oriented to person, place, and time. She appears well-developed and well-nourished. No distress.   HENT:   Head: Normocephalic and atraumatic.   Right Ear: External ear normal.   Left Ear: External ear normal.   Nose: Nose normal.   Eyes: Conjunctivae and EOM are normal.   Neck: Normal range of motion. Neck supple.   Cardiovascular: Normal rate and regular rhythm.    Pulmonary/Chest: Effort normal. No respiratory distress.   Abdominal: She exhibits no distension. There is no tenderness. There is no rigidity, no rebound, no guarding and no CVA tenderness.   Genitourinary:       Pelvic  exam was performed with patient in the knee-chest position. There is no rash on the right labia. There is tenderness on the left labia. There is no rash on the left labia. No vaginal discharge found.   Genitourinary Comments: Patient refuses internal exam. Shamil, RN, chaperone.   External exam; no discharge or lesions. Tenderness as noted. No malodor detected.    Musculoskeletal: Normal range of motion. She exhibits no edema or deformity.   Neurological: She is alert and oriented to person, place, and time. She exhibits normal muscle tone. Coordination normal.   Skin: Skin is warm and dry. Capillary refill takes less than 2 seconds.   Nursing note and vitals reviewed.      Diagnostic Study Results     Labs -     Results     Procedure Component Value Units Date/Time    UA, Reflex to Microscopic (pts 3 + yrs) [[id number]]  (Abnormal) Collected:  06/26/16 1136    Specimen:  Urine Updated:  06/26/16 1209     Urine Type Clean Catch     Color, UA Yellow     Clarity, UA Hazy     Specific Gravity UA 1.017     Urine pH 6.0     Leukocyte Esterase, UA Moderate (A)     Nitrite, UA Negative     Protein, UR Negative     Glucose, UA Negative     Ketones UA Negative     Urobilinogen, UA Negative mg/dL      Bilirubin, UA Negative     Blood, UA Negative     RBC, UA 0 - 2 /hpf      WBC, UA 6 - 10 (A) /hpf      Squamous Epithelial Cells, Urine 0 - 5 /hpf     Urine BHCG POC [[id number]] Collected:  06/26/16 1137     Updated:  06/26/16 1144     Urine bHCG POC Negative          Radiologic Studies -   Radiology Results (24 Hour)     ** No results found for the last 24 hours. **      .    Medical Decision Making   I am the first  provider for this patient.    I reviewed the vital signs, available nursing notes, past medical history, past surgical history, family history and social history.    Vital Signs-Reviewed the patient's vital signs.  Vitals:    06/26/16 1128   BP: 112/72   Pulse: 95   Resp: 16   Temp: 97.5 F (36.4 C)   TempSrc: Oral     SpO2: 100%   Weight: 59 kg   Height: 5\' 5"  (1.651 m)         Pulse Oximetry Analysis - Normal 100% on RA    Old Medical Records: Nursing notes.     ED Course:   1:00 PM -  Patient refuses I&D or needle aspiration.   1:09 PM -  Patient changed mind and would like FNA.     Provider Notes:   Patient anxious with vaginal irritation found to have UA suggestive of UTI. She was very hesitant for visual inspection from provider. Initial examination suggested small bartholin's cyst but on further exam in stirrups, enlargement of labia was physiological and not appropriately tender/fluctuant. Patient has not been sexually active for 10 years. Discharge and odor not consistent with yeast infection or BV. Referral to OBGYN for continued symptoms and to have yearly well-woman exam. Keflex for UTI. Return precautions discussed.       Diagnosis     Clinical Impression:   1. Acute UTI    2. Vaginal pain        Treatment Plan:   ED Disposition     ED Disposition Condition Date/Time Comment    Discharge  Fri Jun 26, 2016  2:01 PM Wendy Buckley discharge to home/self care.    Condition at disposition: Stable            _______________________________    CHART OWNERSHIP: I, Shemekia Patane Buck Mam, PA-C, am the primary clinician of record.  _______________________________     Arnoldo Morale, PA  06/29/16 1722       Westley Foots, MD  06/29/16 [phone]

## 2016-06-26 NOTE — ED Triage Notes (Signed)
Wendy Buckley 46 y.o. female c/o burning, itching and painful urination x2days. Pt complaints of "itching, swollen, and smell" in the vaginal area. No other complaints.

## 2016-07-14 ENCOUNTER — Emergency Department: Payer: Worker's Comp, Other unspecified

## 2016-07-14 ENCOUNTER — Inpatient Hospital Stay
Admission: EM | Admit: 2016-07-14 | Discharge: 2016-07-28 | DRG: 088 | Disposition: A | Discharge status: Rehabilitation Facility | Payer: Worker's Comp, Other unspecified | Attending: Internal Medicine | Admitting: Internal Medicine

## 2016-07-14 DIAGNOSIS — R4182 Altered mental status, unspecified: Secondary | ICD-10-CM | POA: Diagnosis present

## 2016-07-14 DIAGNOSIS — D72829 Elevated white blood cell count, unspecified: Secondary | ICD-10-CM | POA: Diagnosis not present

## 2016-07-14 DIAGNOSIS — T380X5A Adverse effect of glucocorticoids and synthetic analogues, initial encounter: Secondary | ICD-10-CM | POA: Diagnosis not present

## 2016-07-14 DIAGNOSIS — W208XXA Other cause of strike by thrown, projected or falling object, initial encounter: Secondary | ICD-10-CM | POA: Diagnosis present

## 2016-07-14 DIAGNOSIS — G5622 Lesion of ulnar nerve, left upper limb: Secondary | ICD-10-CM | POA: Diagnosis not present

## 2016-07-14 DIAGNOSIS — G43909 Migraine, unspecified, not intractable, without status migrainosus: Secondary | ICD-10-CM | POA: Diagnosis not present

## 2016-07-14 DIAGNOSIS — G9341 Metabolic encephalopathy: Secondary | ICD-10-CM | POA: Diagnosis present

## 2016-07-14 DIAGNOSIS — M62838 Other muscle spasm: Secondary | ICD-10-CM | POA: Diagnosis not present

## 2016-07-14 DIAGNOSIS — R112 Nausea with vomiting, unspecified: Secondary | ICD-10-CM

## 2016-07-14 DIAGNOSIS — R42 Dizziness and giddiness: Secondary | ICD-10-CM

## 2016-07-14 DIAGNOSIS — R030 Elevated blood-pressure reading, without diagnosis of hypertension: Secondary | ICD-10-CM

## 2016-07-14 DIAGNOSIS — E559 Vitamin D deficiency, unspecified: Secondary | ICD-10-CM | POA: Diagnosis present

## 2016-07-14 DIAGNOSIS — Y939 Activity, unspecified: Secondary | ICD-10-CM

## 2016-07-14 DIAGNOSIS — S060X0A Concussion without loss of consciousness, initial encounter: Principal | ICD-10-CM | POA: Diagnosis present

## 2016-07-14 DIAGNOSIS — M5481 Occipital neuralgia: Secondary | ICD-10-CM | POA: Diagnosis not present

## 2016-07-14 DIAGNOSIS — M542 Cervicalgia: Secondary | ICD-10-CM

## 2016-07-14 DIAGNOSIS — Y9289 Other specified places as the place of occurrence of the external cause: Secondary | ICD-10-CM

## 2016-07-14 DIAGNOSIS — I10 Essential (primary) hypertension: Secondary | ICD-10-CM | POA: Diagnosis present

## 2016-07-14 DIAGNOSIS — K5900 Constipation, unspecified: Secondary | ICD-10-CM | POA: Diagnosis not present

## 2016-07-14 DIAGNOSIS — G4700 Insomnia, unspecified: Secondary | ICD-10-CM | POA: Diagnosis not present

## 2016-07-14 DIAGNOSIS — S0990XA Unspecified injury of head, initial encounter: Secondary | ICD-10-CM

## 2016-07-14 DIAGNOSIS — S069XAA Unspecified intracranial injury with loss of consciousness status unknown, initial encounter: Secondary | ICD-10-CM | POA: Diagnosis present

## 2016-07-14 LAB — CBC AND DIFFERENTIAL
Absolute NRBC: 0 10*3/uL
Basophils Absolute Automated: 0.05 10*3/uL (ref 0.00–0.20)
Basophils Automated: 0.6 %
Eosinophils Absolute Automated: 0.18 10*3/uL (ref 0.00–0.70)
Eosinophils Automated: 2.2 %
Hematocrit: 36 % — ABNORMAL LOW (ref 37.0–47.0)
Hgb: 11.1 g/dL — ABNORMAL LOW (ref 12.0–16.0)
Immature Granulocytes Absolute: 0.01 10*3/uL
Immature Granulocytes: 0.1 %
Lymphocytes Absolute Automated: 2.79 10*3/uL (ref 0.50–4.40)
Lymphocytes Automated: 34.5 %
MCH: 25.6 pg — ABNORMAL LOW (ref 28.0–32.0)
MCHC: 30.8 g/dL — ABNORMAL LOW (ref 32.0–36.0)
MCV: 83.1 fL (ref 80.0–100.0)
MPV: 9.9 fL (ref 9.4–12.3)
Monocytes Absolute Automated: 0.7 10*3/uL (ref 0.00–1.20)
Monocytes: 8.7 %
Neutrophils Absolute: 4.35 10*3/uL (ref 1.80–8.10)
Neutrophils: 53.9 %
Nucleated RBC: 0 /100 WBC (ref 0.0–1.0)
Platelets: 344 10*3/uL (ref 140–400)
RBC: 4.33 10*6/uL (ref 4.20–5.40)
RDW: 16 % — ABNORMAL HIGH (ref 12–15)
WBC: 8.08 10*3/uL (ref 3.50–10.80)

## 2016-07-14 LAB — RAPID DRUG SCREEN, URINE
Barbiturate Screen, UR: NEGATIVE
Benzodiazepine Screen, UR: NEGATIVE
Cannabinoid Screen, UR: NEGATIVE
Cocaine, UR: NEGATIVE
Opiate Screen, UR: NEGATIVE
PCP Screen, UR: NEGATIVE
Urine Amphetamine Screen: NEGATIVE

## 2016-07-14 LAB — BASIC METABOLIC PANEL
Anion Gap: 11 (ref 5.0–15.0)
BUN: 18 mg/dL (ref 7.0–19.0)
CO2: 22 mEq/L (ref 22–29)
Calcium: 9.7 mg/dL (ref 8.5–10.5)
Chloride: 107 mEq/L (ref 100–111)
Creatinine: 1.1 mg/dL — ABNORMAL HIGH (ref 0.6–1.0)
Glucose: 89 mg/dL (ref 70–100)
Potassium: 3.7 mEq/L (ref 3.5–5.1)
Sodium: 140 mEq/L (ref 136–145)

## 2016-07-14 LAB — URINALYSIS, REFLEX TO MICROSCOPIC EXAM IF INDICATED
Bilirubin, UA: NEGATIVE
Blood, UA: NEGATIVE
Glucose, UA: NEGATIVE
Ketones UA: NEGATIVE
Leukocyte Esterase, UA: NEGATIVE
Nitrite, UA: NEGATIVE
Protein, UR: NEGATIVE
Specific Gravity UA: 1.01 (ref 1.001–1.035)
Urine pH: 7 (ref 5.0–8.0)
Urobilinogen, UA: 2 mg/dL

## 2016-07-14 LAB — GFR: EGFR: >60

## 2016-07-14 LAB — TROPONIN I: Troponin I: 0.01 ng/mL (ref 0.00–0.09)

## 2016-07-14 MED ORDER — KETOROLAC TROMETHAMINE 30 MG/ML IJ SOLN
30.0000 mg | Freq: Once | INTRAMUSCULAR | Status: AC
Start: 2016-07-14 — End: 2016-07-14
  Administered 2016-07-14: 30 mg via INTRAVENOUS
  Filled 2016-07-14: qty 1

## 2016-07-14 MED ORDER — PROMETHAZINE HCL 25 MG/ML IJ SOLN
12.5000 mg | Freq: Once | INTRAMUSCULAR | Status: AC
Start: 2016-07-14 — End: 2016-07-14
  Administered 2016-07-14: 12.5 mg via INTRAVENOUS
  Filled 2016-07-14: qty 1

## 2016-07-14 MED ORDER — SODIUM CHLORIDE 0.9 % IV BOLUS
1000.0000 mL | Freq: Once | INTRAVENOUS | Status: AC
Start: 2016-07-14 — End: 2016-07-14
  Administered 2016-07-14: 1000 mL via INTRAVENOUS

## 2016-07-14 MED ORDER — ONDANSETRON 4 MG PO TBDP
4.0000 mg | ORAL_TABLET | Freq: Once | ORAL | Status: AC
Start: 2016-07-14 — End: 2016-07-14
  Administered 2016-07-14: 4 mg via ORAL
  Filled 2016-07-14: qty 1

## 2016-07-14 MED ORDER — ACETAMINOPHEN 325 MG PO TABS
650.0000 mg | ORAL_TABLET | Freq: Once | ORAL | Status: AC
Start: 2016-07-14 — End: 2016-07-14
  Administered 2016-07-14: 650 mg via ORAL
  Filled 2016-07-14: qty 2

## 2016-07-14 NOTE — ED Notes (Signed)
ED chart and ED ISHAPED reviewed. [facility] RN Unit [phone]

## 2016-07-14 NOTE — ED Notes (Addendum)
NURSING NOTE FOR THE RECEIVING INPATIENT NURSE   ED NURSE Katheren Shams [phone]   ADMISSION INFORMATION   Wendy Buckley is a 46 y.o. female admitted with a diagnosis of:    1. Altered mental status, unspecified altered mental status type    2. Injury of head, initial encounter    3. Dizziness    4. Elevated blood pressure reading    5. Neck pain    6. Intractable vomiting with nausea, unspecified vomiting type        Isolation: NONE   NURSING CARE   LOC A & Ox4   ADL ADLs:          Independent  Ambulation:  Independent   Pertinent Information and/or   Safety Concerns Pt is A & Ox4 but forgetful and asking the same question over and over again.      VITAL SIGNS     Vitals:    07/14/16 2120   BP: 172/90   Pulse: 79   Resp: 16   Temp:    SpO2: 100%        IV LINES     IV Catheter Size: 20G    Peripheral IV 07/14/16 Left Antecubital (Active)   Site Assessment Clean;Dry;Intact 07/14/2016  8:49 PM   Line Status Saline Locked 07/14/2016  8:49 PM   Dressing Status Clean;Dry;Intact 07/14/2016  8:49 PM   Number of days: 0          LAB RESULTS     Labs Reviewed   CBC AND DIFFERENTIAL - Abnormal; Notable for the following:        Result Value    Hgb 11.1 (*)     Hematocrit 36.0 (*)     MCH 25.6 (*)     MCHC 30.8 (*)     RDW 16 (*)     All other components within normal limits   BASIC METABOLIC PANEL - Abnormal; Notable for the following:     Creatinine 1.1 (*)     All other components within normal limits   TROPONIN I   GFR   URINALYSIS, REFLEX TO MICROSCOPIC EXAM IF INDICATED   RAPID DRUG SCREEN, URINE

## 2016-07-14 NOTE — ED Provider Notes (Signed)
EMERGENCY DEPARTMENT HISTORY AND PHYSICAL EXAM     Physician/Midlevel provider first contact with patient: 07/14/16 2000         Date: 07/14/2016  Patient Name: Wendy Buckley  Attending Physician: Ashley Jacobs, *  Advanced Practice Provider: Modesta Messing    History of Presenting Illness       History Provided By: Patient    Chief Complaint:  Chief Complaint   Patient presents with   . Head Injury     Onset: this morning (pt unable to state exact time)   Timing: sudden  Location: head, neck, chest, back  Quality: pain  Severity:severe  Exacerbating factors: walking, standing  Alleviating factors: advil taken pta  Associated Symptoms: see ROS  Pertinent Negatives: see ROS    Additional History: Wendy Buckley is a 46 y.o. female presenting to the ED with head injury while at work. 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 States denies fall but states she immediately felt dizzy. She reports she went to [employer] comp clinic and was there was this morning until 6:30p. States she was referred to ED. Per paperwork pt brought with her time of injury was 10am and she was found to have 2 knots on the right side of her head. Pt states someone drove her to ER.    PCP: Pcp, Noneorunknown, MD  SPECIALISTS:    There are no discharge medications for this patient.       Past History     Past Medical History:  History reviewed. No pertinent past medical history.    Past Surgical History:  Past Surgical History:   Procedure Laterality Date   . CARPAL TUNNEL RELEASE         Family History:  Family History   Problem Relation Age of Onset   . Ovarian cancer Mother        Social History:  Social History     Social History   . Marital status: Married     Spouse name: N/A   . Number of children: N/A   . Years of education: N/A     Social History Main Topics   . Smoking status: Never Smoker   . Smokeless tobacco: Never Used   . Alcohol use No   .  Drug use: No   . Sexual activity: Not Asked     Other Topics Concern   . None     Social History Narrative    Works at [employer] at home with husband        Allergies:  No Known Allergies    Review of Systems     Review of Systems   Constitutional: Negative for fever.   Eyes: Positive for blurred vision (right eye), photophobia and pain.   Respiratory: Negative for stridor.    Cardiovascular: Positive for chest pain.   Gastrointestinal: Positive for vomiting (2x).   Genitourinary:        Patient's last menstrual period was 06/14/2016.     Musculoskeletal: Positive for back pain, joint pain ( right shoulder pain) and neck pain.   Skin: Negative.    Neurological: Positive for dizziness, tingling (right fingertips) and headaches. Negative for loss of consciousness.   Endo/Heme/Allergies:        Nkda       Physical Exam     Vitals:    07/14/16 1957 07/14/16 1958  07/14/16 2120   BP: (!) 176/91 (!) 176/91 172/90   Pulse: 80 85 79   Resp: 15 16 16    Temp: 98.5 F (36.9 C) 98.5 F (36.9 C)    TempSrc: Oral     SpO2: 98% 99% 100%   Weight: 55.3 kg     Height: 5\' 6"  (1.676 m)         Physical Exam   Constitutional: She is oriented to person, place, and time. She appears well-developed and well-nourished. She appears distressed (mild).   Strange affect.    HENT:   Head: Normocephalic.   Right Ear: External ear normal. No hemotympanum.   Left Ear: External ear normal. No hemotympanum.   Nose: Nose normal. No epistaxis.   Mouth/Throat: Uvula is midline and oropharynx is clear and moist. No trismus in the jaw. No oropharyngeal exudate.   Multiple small contusions to scalp. Pt keeps pushing my hand away limiting exam.   Eyes: Conjunctivae are normal. Pupils are equal, round, and reactive to light. Right eye exhibits no discharge. Left eye exhibits no discharge. No scleral icterus.   Limited EOM because pt states making her dizzy     Neck: Normal range of motion. Neck supple.   Cardiovascular: Normal rate, regular  rhythm, normal heart sounds and intact distal pulses.    Pulmonary/Chest: Effort normal and breath sounds normal. No respiratory distress.   Abdominal: Soft. There is no tenderness.   Musculoskeletal:        Right shoulder: She exhibits decreased range of motion (limited ability due to pain to lift arm overhead), tenderness and pain. She exhibits no swelling, no effusion, no deformity and normal pulse.        Cervical back: She exhibits tenderness and pain.        Thoracic back: She exhibits pain. She exhibits no bony tenderness.        Lumbar back: She exhibits pain. She exhibits no bony tenderness.        Back:    No step off deformity. No crepitus.   Neurological: She is alert and oriented to person, place, and time. Coordination normal. GCS eye subscore is 4. GCS verbal subscore is 5. GCS motor subscore is 6.   nml finger-nose-finger.  nml heel-shin.  No pronator drift.  Able to walk  w/o difficulty.    CRANIAL NERVES:  I: Not tested  II: Blurred vision reported  III/IV/VI: No nystagmus. EOM limited due to pt c/o dizziness.    V: V1/V2/V3-normal sensation bilaterally, corneal reflex not checked  VII: Normal facial expression without droop   VIII: Intact hearing to voice  IX/X: gag reflex intact, uvula midline, speaking, swallowing, palate elevation intact  XI: Shoulder shrug/head turn intact to resistance b/l but limited due to patient c/o neck pain  XII: tongue protruded out in midline          Skin: Skin is warm and dry.   Psychiatric:   Strange affect.    Nursing note and vitals reviewed.      Diagnostic Study Results     Labs -     Results     Procedure Component Value Units Date/Time    Troponin I [[id number]] Collected:  07/14/16 2046    Specimen:  Blood Updated:  07/14/16 2118     Troponin I 0.01 ng/mL     Basic Metabolic Panel [[id number]]  (Abnormal) Collected:  07/14/16 2046    Specimen:  Blood Updated:  07/14/16 2110  Glucose 89 mg/dL      BUN 16.1 mg/dL      Creatinine  1.1 (H) mg/dL      Calcium 9.7 mg/dL      Sodium 096 mEq/L      Potassium 3.7 mEq/L      Chloride 107 mEq/L      CO2 22 mEq/L      Anion Gap 11.0    GFR [[id number]] Collected:  07/14/16 2046     Updated:  07/14/16 2110     EGFR >60.0    CBC with differential [[id number]]  (Abnormal) Collected:  07/14/16 2046    Specimen:  Blood from Blood Updated:  07/14/16 2100     WBC 8.08 x10 3/uL      Hgb 11.1 (L) g/dL      Hematocrit 21.3 (L) %      Platelets 344 x10 3/uL      RBC 4.33 x10 6/uL      MCV 83.1 fL      MCH 25.6 (L) pg      MCHC 30.8 (L) g/dL      RDW 16 (H) %      MPV 9.9 fL      Neutrophils 53.9 %      Lymphocytes Automated 34.5 %      Monocytes 8.7 %      Eosinophils Automated 2.2 %      Basophils Automated 0.6 %      Immature Granulocyte 0.1 %      Nucleated RBC 0.0 /100 WBC      Neutrophils Absolute 4.35 x10 3/uL      Abs Lymph Automated 2.79 x10 3/uL      Abs Mono Automated 0.70 x10 3/uL      Abs Eos Automated 0.18 x10 3/uL      Absolute Baso Automated 0.05 x10 3/uL      Absolute Immature Granulocyte 0.01 x10 3/uL      Absolute NRBC 0.00 x10 3/uL           Radiologic Studies -   Radiology Results (24 Hour)     Procedure Component Value Units Date/Time    CT Cervical Spine WO Contrast [[id number]] Collected:  07/14/16 2048    Order Status:  Completed Updated:  07/14/16 2053    Narrative:       CT CERVICAL SPINE WO CONTRAST    CLINICAL INDICATION: Injury. neck pain s/p fall    TECHNIQUE:   Initially axial acquisition was obtained with thin cuts.  Images were processed with bone-algorithm. Subsequently MPR reformatting  was done to better demonstrate the relationship of the vertebral bodies.  CT Dose reduction technique: One or more the following dose reduction  techniques were utilized: Automated exposure control; Adjustment of the  MVA and/or KVP according to patient's size; Use of the iterative  reconstruction technique.    FINDINGS: The vertebral body heights are well maintained. The spinous  processes are  intact. The dens is intact. C1-C2 articulation is intact.  Facets are well aligned. Straightening of the curvature probably due to  spasm.   The disc spaces are well maintained. No acute fracture seen.      Impression:         SPASM. NO ACUTE FRACTURE.    [name], MD   07/14/2016 8:49 PM    CT Head WO Contrast [[id number]] Collected:  07/14/16 2046    Order Status:  Completed Updated:  07/14/16 2051    Narrative:  CT HEAD WO CONTRAST    CLINICAL INDICATION:  . HEAD TRAUMA, CLOSED, MOD-SEVERE    TECHNIQUE:  Noncontrast CT scan of the head was performed. Axial images  were obtained.  CT Dose reduction technique: One or more the following dose reduction  techniques were utilized: Automated exposure control; Adjustment of the  MVA and/or KVP according to patient's size; Use of the iterative  reconstruction technique.    FINDINGS:  The ventricles and cisterns are clear. No acute bleed. No  acute cortical ischemic abnormality. No mass effect or midline shift. No  gross abnormality in the posterior fossa; beam hardening artifacts are  seen.  No evidence for an acute intracranial abnormality.        Impression:           NO ACUTE INTRACRANIAL ABNORMALITY.    [name], MD   07/14/2016 8:47 PM    Chest 2 Views [[id number]] Collected:  07/14/16 2042    Order Status:  Completed Updated:  07/14/16 2047    Narrative:       XR CHEST 2 VIEWS    CLINICAL INDICATION: Chest pain      COMPARISON: 04/07/2015    TECHNIQUE: The following radiographs were obtained per protocol:   XR  CHEST 2 VIEWS    FINDINGS:  Cardiomediastinal silhouette is within normal limits. No  alveolar infiltrate. No effusion. No active disease.  The osseous  structures are unremarkable. No acute abnormalities are apparent.      Impression:            NO SIGNIFICANT ABNORMALITY.    [name], MD   07/14/2016 8:43 PM    Shoulder Right 2+ Views [[id number]] Collected:  07/14/16 2032    Order Status:  Completed Updated:  07/14/16 2037    Narrative:       XR  SHOULDER RIGHT 2+ VIEWS    CLINICAL INDICATION:   Right-sided shoulder pain.      TECHNIQUE: The following Radiographs obtained per protocol:  XR SHOULDER  RIGHT 2+ VIEWS    FINDINGS:  Osseous structures are intact. Alignment well maintained. No  acute fracture. No periosteal irregularity.        Impression:        NO SIGNIFICANT ABNORMALITY.    [name], MD   07/14/2016 8:33 PM      .    Medical Decision Making   I am the first provider for this patient.    I reviewed the vital signs, available nursing notes, past medical history, past surgical history, family history and social history.    Vital Signs-Reviewed the patient's vital signs.     Patient Vitals for the past 12 hrs:   BP Temp Pulse Resp   07/14/16 2120 172/90 - 79 16   07/14/16 1958 (!) 176/91 98.5 F (36.9 C) 85 16   07/14/16 1957 (!) 176/91 98.5 F (36.9 C) 80 15       Pulse Oximetry Analysis - Normal 99% on RA       Procedures:   Procedures        EKG:  Interpreted by the EP.   Time Interpreted: 1953   Rate: 83   Rhythm: Normal Sinus Rhythm    Interpretation: no STEMI. Poor r-wave progression.   Comparison: Unchanged from 04/07/2015    Heart Score      Value   History  0   EKG  0   Risk Factors  1   Total (  with age)  1   Onset of pain (time of START of last episode of chest pain)?  >6 hrs ago          Old Medical Records: Old medical records.  Nursing notes.     ED Course:   ED Course as of Jul 14 2201   Tue Jul 14, 2016   2112 9:12 PM -  pt returned from xray/CT. States she didn't receive medications. Per co-worker at bedside pt is very forgetful. Confirmed with nurse that pt did get medication prior to going to imaging. Pt also states she vomited while in imaging which nurse confirmed by calling radiology staff.   [MG]   2137 9:37 PM -  S/w pt and her co-worker. Co-worker states she is not this forgetful normally. Per pt she again is stating she did not receive any medication. Pt correctly stated the date, president and city. Continues to behave  strangely- repeating herself and asking the same questions over and over. Declined to be transferred by EMS and prefers to be driven by co-worker who feels comfortable with driving the patient over for admission.   [MG]   2150 9:50 PM -  D/w pt and her co-worker. Pt does remember the plan for admission that we discussed. Pt denied any drug use. S/w Dr. Oran Rein who states he accepts admission for obs to medical floor.  [MG]   2157 9:57 PM -  pt now dry heaving in the room again and c/o right head pain mainly over right mastoid and right side neck. Pt now agrees to go by ambulance.  [MG]      ED Course User Index  [MG] Modesta Messing, [place]       Provider Notes:    46 y.o. female head injury this morning associated with vomiting, headache, neck pain, shoulder pain, chest pain, dizziness. Pt found to be altered in ED with forgetfulness, repetitive questioning and not acting like herself per co-worker. Intractable vomiting despite zofran/phenergan.  Will plan to admit for obs to [facility] for further monitoring.      D/w Ashley Jacobs, *    Discharge Prescriptions     None            Diagnosis     Clinical Impression:   1. Altered mental status, unspecified altered mental status type    2. Injury of head, initial encounter    3. Dizziness    4. Elevated blood pressure reading    5. Neck pain    6. Intractable vomiting with nausea, unspecified vomiting type        Treatment Plan:   ED Disposition     ED Disposition Condition Date/Time Comment    Observation  Tue Jul 14, 2016 10:00 PM Admitting Physician: Oneida Alar [[id number]]   Diagnosis: Altered mental status [780.97.ICD-9-CM]   Estimated Length of Stay: < 2 midnights   Tentative Discharge Plan?: Home or Self Care [1]   Patient Class: Observation [104]              _______________________________    CHART OWNERSHIPDorette Grate, PA-C, am the primary clinician of record.  _______________________________     Modesta Messing, PA  07/15/16 0112        Ashley Jacobs, MD  07/18/16 [phone]

## 2016-07-14 NOTE — ED Notes (Signed)
Pt stated she vomited x1 when she was getting CT scan.

## 2016-07-14 NOTE — ED Triage Notes (Addendum)
Work related injury,empty milk crates fell on pt's head this morning, + dizziness, + vomiting, no LOC

## 2016-07-15 ENCOUNTER — Telehealth: Payer: Self-pay | Admitting: Physician Assistant

## 2016-07-15 DIAGNOSIS — R413 Other amnesia: Secondary | ICD-10-CM

## 2016-07-15 DIAGNOSIS — S069X1A Unspecified intracranial injury with loss of consciousness of 30 minutes or less, initial encounter: Secondary | ICD-10-CM

## 2016-07-15 DIAGNOSIS — R569 Unspecified convulsions: Secondary | ICD-10-CM

## 2016-07-15 DIAGNOSIS — S069XAA Unspecified intracranial injury with loss of consciousness status unknown, initial encounter: Secondary | ICD-10-CM | POA: Diagnosis present

## 2016-07-15 LAB — COMPREHENSIVE METABOLIC PANEL
ALT: 7 U/L (ref 0–55)
AST (SGOT): 14 U/L (ref 5–34)
Albumin/Globulin Ratio: 1.1 (ref 0.9–2.2)
Albumin: 3.2 g/dL — ABNORMAL LOW (ref 3.5–5.0)
Alkaline Phosphatase: 38 U/L (ref 37–106)
Anion Gap: 7 (ref 5.0–15.0)
BUN: 14 mg/dL (ref 7.0–19.0)
Bilirubin, Total: 0.4 mg/dL (ref 0.2–1.2)
CO2: 20 mEq/L — ABNORMAL LOW (ref 22–29)
Calcium: 8.9 mg/dL (ref 8.5–10.5)
Chloride: 111 mEq/L (ref 100–111)
Creatinine: 0.7 mg/dL (ref 0.6–1.0)
Globulin: 2.8 g/dL (ref 2.0–3.6)
Glucose: 86 mg/dL (ref 70–100)
Potassium: 4.1 mEq/L (ref 3.5–5.1)
Protein, Total: 6 g/dL (ref 6.0–8.3)
Sodium: 138 mEq/L (ref 136–145)

## 2016-07-15 LAB — CBC
Absolute NRBC: 0 10*3/uL
Hematocrit: 33.8 % — ABNORMAL LOW (ref 37.0–47.0)
Hgb: 10.3 g/dL — ABNORMAL LOW (ref 12.0–16.0)
MCH: 25.6 pg — ABNORMAL LOW (ref 28.0–32.0)
MCHC: 30.5 g/dL — ABNORMAL LOW (ref 32.0–36.0)
MCV: 84.1 fL (ref 80.0–100.0)
MPV: 10.2 fL (ref 9.4–12.3)
Nucleated RBC: 0 /100 WBC (ref 0.0–1.0)
Platelets: 286 10*3/uL (ref 140–400)
RBC: 4.02 10*6/uL — ABNORMAL LOW (ref 4.20–5.40)
RDW: 16 % — ABNORMAL HIGH (ref 12–15)
WBC: 5.18 10*3/uL (ref 3.50–10.80)

## 2016-07-15 LAB — GFR: EGFR: >60

## 2016-07-15 LAB — TSH: TSH: 1.87 u[IU]/mL (ref 0.35–4.94)

## 2016-07-15 LAB — HEMOLYSIS INDEX: Hemolysis Index: 6 (ref 0–18)

## 2016-07-15 MED ORDER — KETOROLAC TROMETHAMINE 30 MG/ML IJ SOLN
30.0000 mg | Freq: Three times a day (TID) | INTRAMUSCULAR | Status: DC
Start: 2016-07-15 — End: 2016-07-16
  Administered 2016-07-15 (×2): 30 mg via INTRAVENOUS
  Filled 2016-07-15 (×2): qty 1

## 2016-07-15 MED ORDER — ONDANSETRON HCL 4 MG/2ML IJ SOLN
4.0000 mg | Freq: Four times a day (QID) | INTRAMUSCULAR | Status: DC | PRN
Start: 2016-07-15 — End: 2016-07-28
  Administered 2016-07-18 – 2016-07-19 (×3): 4 mg via INTRAVENOUS
  Filled 2016-07-15 (×3): qty 2

## 2016-07-15 MED ORDER — MAGNESIUM SULFATE IN D5W 1-5 GM/100ML-% IV SOLN
1.0000 g | Freq: Two times a day (BID) | INTRAVENOUS | Status: AC
Start: 2016-07-15 — End: 2016-07-16
  Administered 2016-07-15 – 2016-07-16 (×4): 1 g via INTRAVENOUS
  Filled 2016-07-15 (×4): qty 100

## 2016-07-15 MED ORDER — NALOXONE HCL 0.4 MG/ML IJ SOLN (WRAP)
0.2000 mg | INTRAMUSCULAR | Status: DC | PRN
Start: 2016-07-15 — End: 2016-07-28

## 2016-07-15 MED ORDER — PROCHLORPERAZINE EDISYLATE 5 MG/ML IJ SOLN
10.0000 mg | Freq: Three times a day (TID) | INTRAMUSCULAR | Status: DC
Start: 2016-07-15 — End: 2016-07-16
  Administered 2016-07-15 (×2): 10 mg via INTRAVENOUS
  Filled 2016-07-15 (×3): qty 2

## 2016-07-15 MED ORDER — DIPHENHYDRAMINE HCL 50 MG/ML IJ SOLN
12.5000 mg | Freq: Three times a day (TID) | INTRAMUSCULAR | Status: DC
Start: 2016-07-15 — End: 2016-07-16
  Administered 2016-07-15 (×2): 12.5 mg via INTRAVENOUS
  Filled 2016-07-15 (×2): qty 1

## 2016-07-15 MED ORDER — IBUPROFEN 400 MG PO TABS
400.0000 mg | ORAL_TABLET | ORAL | Status: DC | PRN
Start: 2016-07-15 — End: 2016-07-20
  Administered 2016-07-17: 02:00:00 400 mg via ORAL
  Filled 2016-07-15: qty 1

## 2016-07-15 MED ORDER — ACETAMINOPHEN 325 MG PO TABS
650.0000 mg | ORAL_TABLET | ORAL | Status: DC | PRN
Start: 2016-07-15 — End: 2016-07-28
  Administered 2016-07-15 – 2016-07-28 (×11): 650 mg via ORAL
  Filled 2016-07-15 (×12): qty 2

## 2016-07-15 MED ORDER — ONDANSETRON HCL 4 MG/2ML IJ SOLN
4.0000 mg | Freq: Four times a day (QID) | INTRAMUSCULAR | Status: DC | PRN
Start: 2016-07-15 — End: 2016-07-22
  Filled 2016-07-15: qty 2

## 2016-07-15 MED ORDER — ONDANSETRON 4 MG PO TBDP
4.0000 mg | ORAL_TABLET | Freq: Four times a day (QID) | ORAL | Status: DC | PRN
Start: 2016-07-15 — End: 2016-07-28
  Administered 2016-07-16 – 2016-07-27 (×6): 4 mg via ORAL
  Filled 2016-07-15 (×6): qty 1

## 2016-07-15 NOTE — Progress Notes (Signed)
EEG performed as per order, result will follow.

## 2016-07-15 NOTE — Progress Notes (Signed)
Received a call back from pt's brother Jeri Modena. He said he would come to the hospital right after work.

## 2016-07-15 NOTE — Progress Notes (Signed)
Cm attempted to complete the IDPA but pt was severely confused. CM lvm for the pt's brother.    Noel Christmas, LGSW  [phone]

## 2016-07-15 NOTE — Progress Notes (Signed)
3:55 PM    Patient independently seen and examined.  Briefly, Wendy Buckley is a 46 y.o. female admitted with Mild TBI (traumatic brain injury). Vital signs stable, but her exam is notable for dizziness, confusion, and persistently disoriented. On neuro exam she is able to follow commands but often becomes confused, repeats herself, distractible. This could all be post-concussive picture though I am impressed at the degree of her symptoms. Tried to call brother to get baseline but no answers. For now, consult to neuro, likely plan to check MRI brain with and without.  Supportive care for HA.     Remainder of plan as outlined in HP note.    Elna Breslow  3:55 PM

## 2016-07-15 NOTE — H&P (Signed)
[facility]      Patient: Wendy Buckley  Date: 07/14/2016   DOB: [birth year]  Admission Date: 07/14/2016   MRN: [id number]  Attending: Oneida Alar         Chief Complaint   Patient presents with   . Head Injury      History Gathered From: Patient    HISTORY AND PHYSICAL     Cambri Plourde is a 46 y.o. female with no significant medical history who presented with head pain and dizziness following work related head trauma. 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  Patient did not have loss of consciousness, but complained of pain and dizziness.  She was sensitive outside hospital emergency department for evaluation where had imaging was unremarkable.  Patient's continued to have dizziness, confusion/disorientation, and blurry vision in her right eye.  Patient also had elevated blood pressure and intractable nausea and vomiting.  She was transferred to another [place] for inpatient observation and monitoring.    History reviewed. No pertinent past medical history.    Past Surgical History:   Procedure Laterality Date   . CARPAL TUNNEL RELEASE         Prior to Admission medications    Not on File       No Known Allergies    PRIMARY CARE MD: Pcp, Noneorunknown, MD    Family History   Problem Relation Age of Onset   . Ovarian cancer Mother        Social History   Substance Use Topics   . Smoking status: Never Smoker   . Smokeless tobacco: Never Used   . Alcohol use No       REVIEW OF SYSTEMS   12-Point Review of Systems completed and was otherwise negative except for as noted in HPI.    PHYSICAL EXAM   Vital Signs (most recent): BP 129/81   Pulse 70   Temp 97.2 F (36.2 C) (Oral)   Resp 16   Ht 1.676 m (5\' 6" )   Wt 56.9 kg (125 lb 8 oz)   LMP 06/14/2016   SpO2 100%   BMI 20.26 kg/m     Constitutional: No apparent distress.  Sleeping but arousable.  HEENT: NC/AT, PERRL, no  scleral icterus or conjunctival pallor, MMM, oropharynx without erythema or exudate  Neck: supple, no cervical or supraclavicular lymphadenopathy or masses  Cardiovascular: RRR, normal S1 S2, no murmurs, gallops, no JVD, Non-displaced PMI.  Respiratory: Normal rate. No increased work of breathing. Clear to auscultation and percussion bilaterally.   Gastrointestinal: +BS, non-distended, soft, non-tender, no rebound or guarding, no hepatosplenomegaly  Genitourinary: no suprapubic or costovertebral angle tenderness  Musculoskeletal: ROM and motor strength grossly normal. No clubbing, edema, or cyanosis. DP and radial pulses 2+ and symmetric.  Neurologic: EOMI, CN 2-12 grossly intact. no gross motor or sensory deficits  Psychiatric: AAOx3, affect and mood appropriate. The patient is alert, interactive, appropriate.    Lines/Drains/Airways:  Patient Lines/Drains/Airways Status    Active Lines, Drains and Airways     Name:   Placement date:   Placement time:   Site:   Days:    Peripheral IV 07/14/16 Left Antecubital  07/14/16    2045    Antecubital    less than 1  Exam done by Oneida Alar, MD on 07/15/16 at 12:38 AM      LABS & IMAGING     Recent Labs      07/14/16   2046   WBC  8.08   Hgb  11.1*   Hematocrit  36.0*   Platelets  344     No results for input(s): PT, INR, PTT in the last 72 hours.   Recent Labs  Lab 07/14/16  2046   Sodium 140   Potassium 3.7   Chloride 107   CO2 22   BUN 18.0   Creatinine 1.1*   Calcium 9.7   Glucose 89             Microbiology:   Microbiology Results     None          Imaging:  Chest 2 Views    Result Date: 07/14/2016       NO SIGNIFICANT ABNORMALITY. [name], MD 07/14/2016 8:43 PM    Ct Head Wo Contrast    Result Date: 07/14/2016      NO ACUTE INTRACRANIAL ABNORMALITY. [name], MD 07/14/2016 8:47 PM    Ct Cervical Spine Wo Contrast    Result Date: 07/14/2016    SPASM. NO ACUTE FRACTURE. [name], MD 07/14/2016 8:49 PM    Shoulder Right 2+ Views    Result Date:  07/14/2016   NO SIGNIFICANT ABNORMALITY. [name], MD 07/14/2016 8:33 PM        EMERGENCY DEPARTMENT COURSE:  Orders Placed This Encounter   Procedures   . CT Head WO Contrast   . CT Cervical Spine WO Contrast   . Chest 2 Views   . Shoulder Right 2+ Views   . CBC with differential   . Basic Metabolic Panel   . Troponin I   . GFR   . UA with reflex to micro (pts  3 + yrs)   . Urine Tox Screen (Rapid Drug Screen)   . Comprehensive metabolic panel   . CBC   . Hemolysis index   . GFR   . Diet regular   . Vital Signs-Repeat   . Ambulate Patient   . Pulse Oximetry   . Progressive Mobility Protocol   . Notify physician   . I/O   . Height   . Weight   . Skin assessment   . Education: Activity   . Education: Disease Process & Condition   . Education: Pain Management   . Education: Falls Risk   . Education: Smoking Cessation   . Place sequential compression device   . Maintain sequential compression device   . Full Code   . Saline lock IV   . Place (admit) for Observation Services   . [facility] ED Bed Request (Observation)       ASSESSMENT & PLAN     Ethell Blatchford is a 46 y.o. female presenting with Mild TBI (traumatic brain injury).    # Mild traumatic brain injury: Likely concussion secondary to work-related head trauma.  Had imaging at outside hospital is unremarkable.  Patient has symptoms of dizziness and nausea with mild confusion/memory loss.  -Continue to monitor, if symptoms persist, consider follow-up head imaging with brain MRI  -Zofran for nausea.  -Ibuprofen for pain    #Head and neck pain: Secondary to work-related trauma  -Pain control with ibuprofen    #Hypertension: Likely pain related  -Pain control, defer antihypertensives at this time    #  Nutrition  Regular diet, as tolerated    # VTE Prophylaxis  SCDs    # CODE STATUS: Full code    Anticipated medical stability for discharge: 1-2 Days    Service status/Reason for ongoing hospitalization: Observation for concussion  Anticipated Discharge Needs:  None    Signed,  Oneida Alar    07/15/2016 12:38 AM  Time Elapsed: 30 minutes

## 2016-07-15 NOTE — Plan of Care (Signed)
Problem: Pain  Goal: Pain at adequate level as identified by patient  Outcome: Progressing   07/15/16 2035   Goal/Interventions addressed this shift   Pain at adequate level as identified by patient Identify patient comfort function goal;Assess for risk of opioid induced respiratory depression, including snoring/sleep apnea. Alert healthcare team of risk factors identified.;Assess pain on admission, during daily assessment and/or before any "as needed" intervention(s);Reassess pain within 30-60 minutes of any procedure/intervention, per Pain Assessment, Intervention, Reassessment (AIR) Cycle;Evaluate if patient comfort function goal is met;Evaluate patient's satisfaction with pain management progress;Offer non-pharmacological pain management interventions;Include patient/patient care companion in decisions related to pain management as needed   Benadryl ,compazine, toradol and magnesium seem to help pt with headache. Pt less c/o of headache after received these medications.     Problem: Neurological Deficit  Goal: Neurological status is stable or improving  Outcome: Not Progressing   07/15/16 2035   Goal/Interventions addressed this shift   Neurological status is stable or improving Monitor/assess/document neurological assessment (Stroke: every 4 hours);Observe for seizure activity and initiate seizure precautions if indicated;Perform CAM Assessment   Pt's confused, forgetful, asking repetitive for shoes to go home. Impulsive. Trying to get OOB often and pulling IV. Reoriented prn. RVM at bed side. Neuro check q4hr. Continue to monitor closely for any change in condition.    Problem: Impaired Mobility  Goal: Mobility/Activity is maintained at optimal level for patient  Outcome: Progressing   07/15/16 2035   Goal/Interventions addressed this shift   Mobility/activity is maintained at optimal level for patient Increase mobility as tolerated/progressive mobility;Encourage independent activity per ability;Maintain  proper body alignment;Perform active/passive ROM;Plan activities to conserve energy, plan rest periods;Reposition patient every 2 hours and as needed unless able to reposition self;Assess for changes in respiratory status, level of consciousness and/or development of fatigue;Consult/collaborate with Physical Therapy and/or Occupational Therapy   Fall precaution maintained. Re instructed to call for assistance, wait for help to arrive before getting out of bed. Pt educated about the importance of preventing falls and about proper safety.  Pt demonstrated understanding of education by repeating back.  Pt's informed of POC/goals. Verbalized understanding of disease process, treatment plan, medication, and consequences of noncompliance. Bed in lowest position. Bed wheels locked. Bed alarm on. Non-skid socks on bilaterally. Floor mats in used. Call light, phone, over bed table and personal belongings in reach. Participate in Hourly rounding. White board updated.      Comments: Fair appetite. Unsteady gait, able to ambulate to the bathroom with assist. Voided without difficulty. Pt's brother at bed side.

## 2016-07-15 NOTE — PT Eval Note (Addendum)
Wendy Buckley  Physical Therapy Evaluation    Patient: Wendy Buckley  MRN#: [id number]  Unit: [facility]  Bed: X5284/X3244-W    Time of Evaluation:  Time Calculation  PT Received On: 07/15/16  Start Time: 0913  Stop Time: 0935  Time Calculation (min): 22 min    Chart Review and Collaboration with Care Team: 5 minutes, not included in above time    PT Visit Number: 1    Consult received for Wendy Buckley for PT Evaluation and Treatment.  Patient's medical condition is appropriate for Physical therapy intervention at this time.    Activity Orders:  None specified. RN ok'd pt to work with PT.    Precautions and Contraindications:  Precautions  Weight Bearing Status: no restrictions  Other Precautions: fall risk    Medical Diagnosis:  Neck pain [M54.2]  Dizziness [R42]  Elevated blood pressure reading [R03.0]  Injury of head, initial encounter [S09.90XA]  Altered mental status, unspecified altered mental status type [R41.82]  Intractable vomiting with nausea, unspecified vomiting type [R11.2]    History of Present Illness:  Wendy Buckley is a 46 y.o. female admitted on 07/14/2016 with head pain and dizziness following work related head trauma. Pt reports no LOC. Empty milk crate fell and struck head/body. Pt has had increased confusion/disorientation following incident.    Patient Active Problem List   Diagnosis   . CAP (community acquired pneumonia)   . Hypokalemia   . Hyperglycemia   . Cough   . Wheezing   . Altered mental status   . Mild TBI (traumatic brain injury)       Past Medical/Surgical History:  History reviewed. No pertinent past medical history.  Past Surgical History:   Procedure Laterality Date   . CARPAL TUNNEL RELEASE         X-Rays/Tests/Labs:  Lab Results   Component Value Date/Time    HGB 10.3 (L) 07/15/2016 04:54 AM    HCT 33.8 (L) 07/15/2016 04:54 AM    K 4.1 07/15/2016 04:55 AM    NA 138 07/15/2016 04:55 AM    TROPI 0.01 07/14/2016 08:46 PM       All imaging  reviewed, please see chart for details.    Social History:  Prior Level of Function  Prior level of function: Independent with ADLs, Ambulates independently    Home Living Arrangements  Living Arrangements: Alone  Type of Home: House  Home Layout:  (per pt, lives in one-level house)  Home Living - Notes / Comments: Currently, pt is poor historian so unsure of accuracy of PLOF.    Subjective:  Patient is agreeable to participation in the therapy session. Nursing clears patient for therapy. "Is it time for me to go home? My right hand has been really stiff. My fingers (R hand) has been tingling."    Patient Goal: agreed to work with PT  Pain Assessment  Pain Assessment: Numeric Scale (0-10)  Pain Score: 8-severe pain  POSS Score: Awake and Alert  Pain Location: Head (headache)  Pain Orientation: Right;Posterior  Pain Intervention(s): Medication (See eMAR);Cold applied;Distraction          Objective:  Observation of Patient/Vital Signs:  VSS throughout session.      Patient received in bed with telemetry , SCDs, bed alarm  and [employer]-patient monitoring in place.         Cognitive Status and Neuro Exam:  Cognition/Neuro Status  Arousal/Alertness: Appropriate responses to stimuli  Attention Span: Difficulty attending to  directions  Orientation Level: Oriented to person;Disoriented to place;Disoriented to situation (stated correct year only)  Memory: Decreased short term memory;Decreased recall of recent events  Following Commands: Follows one step commands with repetition;Follows one step commands with increased time  Safety Awareness: moderate verbal instruction  Insights: Decreased awareness of deficits;Educated in safety awareness  Problem Solving: moderate assistance  Behavior: calm;cooperative  Motor Planning: decreased processing speed  Coordination: GMC impaired   Reported increased dizziness when looking upward in either direction.    Musculoskeletal Examination  Gross ROM  Neck/Trunk ROM: reduced by 25% (2/2 R  sided neck pain per pt)  Right Lower Extremity ROM: within functional limits  Left Lower Extremity ROM: within functional limits  Gross Strength  Right Lower Extremity Strength: 4-/5  Left Lower Extremity Strength: 4-/5   Grossly diminished RUE grip strength compared to L hand.    Functional Mobility:  Functional Mobility  Supine to Sit: Independent  Sit to Supine: Independent  Sit to Stand: Minimal Assist  Stand to Sit: Stand by Assist     Locomotion  Ambulation: Unable to assess (Comment) (when initially standing, pt reported having increased dizziness and immediate sit and ultimately returning to supine)    Balance  Balance  Balance: needs focused assessment  Sitting - Static: Good  Sitting - Dynamic: Good  Standing - Static: Poor (frequent BLE buckling with static standing)  Standing - Dynamic: Not tested    Participation and Activity Tolerance  Participation and Endurance  Participation Effort: good  Endurance: Tolerates < 10 min exercise, no significant change in vital signs  [place] Dyspnea Scale: 0 Dyspnea    Educated the patient to role of physical therapy, plan of care, goals of therapy and safety with mobility and ADLs.    Patient left in bed with SCDs and bed alarm in place and call bell and all personal items/needs within reach. RN notified of session outcome.      Assessment:  Wendy Buckley is a 46 y.o. female admitted 07/14/2016. Pt would benefit from Physical Therapy to address deficits and increase functional independence. There are few comorbidities or other factors that affect plan of care and require modification of task including: increased confusion/disorientation (very difficult to reorient throughout session).  Pt's functional mobility is impacted by: balance, coordination/motor control, dizziness, gait, strength and safety awareness.  Standardized tests and exams incorporated into evaluation include AMPAC mobility, balance, ROM  and Strength.  Pt demonstrates an evolving  clinical presentation due to acute onset of increased confusion/disorientation post head trauma.      Complexity Level Hx and Co-  morbidites Examination Clinical Decision Making Clinical Presentation   Low no impact 1-2 elements Limited options Stable       Plan:  Treatment/Interventions: Exercise, Gait training, LE strengthening/ROM, Cognitive reorientation, Patient/family training, Continued evaluation  PT Frequency: 3-4x/wk  Risks/Benefits/POC Discussed with Pt/Family: With patient    PMP Activity: Step 4 - Dangle at Bedside (but has been amb in room per flowsheet)  Distance Walked (ft) (Step 6,7): 0 Feet (with PT)    G codes: not indicated             AM-PAC:yes        PT Basic Mobility Raw Score: 20  CMS 0-100% Score: 35.83%               Goals:  Goals  Goal Formulation: With patient  Time for Goal Acheivement: By time of discharge  Goals: Select goal  Pt  Will Perform Sit to Stand: independent, to maximize functional mobility and independence  Pt Will Transfer Bed/Chair: independent, to maximize functional mobility and independence  Pt Will Ambulate: 151-200 feet, independent, to maximize functional mobility and independence         DME Recommended for Discharge: Other (Comment) (none currently)  Discharge Recommendation: Other (Comment) (TBD pending further OOB assessment-only tolerated sit to stand x1 before wanting to return to supine)    Ranae Pila, PT, DPT  [phone]  07/15/2016 10:05 AM

## 2016-07-15 NOTE — Telephone Encounter (Signed)
error 

## 2016-07-15 NOTE — Progress Notes (Signed)
Unable to complete MRI screening because pt's confused. Called and left voice messages x2 for pt's brother Angely Dietz and pt's daughter Marlynn Perking.

## 2016-07-15 NOTE — Plan of Care (Signed)
Problem: Physical Therapy  Goal: By discharge, patient will perform mobility at the patient's highest functional potential. See PT evaluation/note for goals.  Outcome: Progressing  Discharge Recommendation: Other (Comment) (TBD pending further OOB assessment)  DME Recommended for Discharge: Other (Comment) (none currently)    Is an Occupational Therapy Evaluation Indicated at this time? No, the patient does not require a OT evaluation.    Treatment/Interventions: Exercise, Gait training, LE strengthening/ROM, Cognitive reorientation, Patient/family training, Continued evaluation  PT Frequency: 3-4x/wk     PMP Activity: Step 4 - Dangle at Bedside (but has been amb in room per flowsheet)  Distance Walked (ft) (Step 6,7): 0 Feet (with PT)  (Please See Therapy Evaluation for device and assistance level needed)    Goals:   Goals  Goal Formulation: With patient  Time for Goal Acheivement: By time of discharge  Goals: Select goal  Pt Will Perform Sit to Stand: independent, to maximize functional mobility and independence  Pt Will Transfer Bed/Chair: independent, to maximize functional mobility and independence  Pt Will Ambulate: 151-200 feet, independent, to maximize functional mobility and independence

## 2016-07-15 NOTE — Consults (Signed)
I have independently interviewed and examined the patient. I agree with this entire note with the following clarifications and exceptions. In summary:    I have personally reviewed relevant neuro-imaging that reveals negative had CT    46y/o woman with headache, dizziness and fall. She has amnesia for the circumstances surrounding the fall. Although her headache is likely a migraine, the fact that she has amnesia suggests concussion (w/ postconcussive syndrome) vs epilepsy. Seizure are a common sequelae of mild head injury.     MRI and EEG are medically necessary to evaluate her amnesia, confusion as well as TBI.       Thank you for the consultation.    Lora Havens, MD PHD  [facility]                         [facility] Consultation Note                                       Date Time: 07/15/16 2:12 PM  Patient Name: Wendy Buckley  Requesting Physician: Elna Breslow,*  Date of Admission: 07/14/2016    CC / Reason for Consultation: Head injury, confusion           Assessment:   S/p head injury resulting in HA, dizziness, confusion/disorientation in a 46 y/o female with no significant PMH. Etiology unclear, differentials include post concussive syndrome vs seizure vs other      Plan:   -Recommend MRI brain WWO  -Recommend rEEG  -Check TSH, UA  -For headache: Toradol 30 mg q8h, Compazine 10 mg q8h, Benadryl 12.5 q8h, Magnesium 1 gm BID  -PT/OT as needed  -Medical management per primary team  -Further recommendations pending results of above         HPI   Mckynlie Vanderslice is a 46 y.o. female with no significant PMH who presents to the hospital S/p head injury while at work. 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 Pt denies associated LOC but cannot recall all the events. She does endorse associated dizziness, headache, vomiting, and disorientation immediately following the head injury. Pt was referred to the ED where she had an  unremarkable HCT. Given the fact pt had ongoing headache, dizziness, confusion and R eye blurry vision pt was admitted. Pt denies any seizures in the past. Unclear patient's baseline mental status or functionality. Hx is limited as pt is poor historian. Pt denies flu like symptoms, weakness, numbness, speech disturbances, facial droop, drug/alchol use.     Past Medical Hx   History reviewed. No pertinent past medical history.       Past Surgical Hx:     Past Surgical History:   Procedure Laterality Date   . CARPAL TUNNEL RELEASE          Family Medical History:      Family History   Problem Relation Age of Onset   . Ovarian cancer Mother        Social Hx     Social History     Social History   . Marital status: Married     Spouse name: N/A   . Number of children: N/A   . Years of education: N/A     Occupational History   . Not on file.  Social History Main Topics   . Smoking status: Never Smoker   . Smokeless tobacco: Never Used   . Alcohol use No   . Drug use: No   . Sexual activity: Not on file     Other Topics Concern   . Not on file     Social History Narrative    Works at [employer]    Lives at home with husband        Meds     Home :   Prior to Admission medications    Not on File      Inpatient :   Current Facility-Administered Medications   Medication Dose Route Frequency         Allergies    Patient has no known allergies.      Review of Systems   Pertinent items are noted in HPI.  All other systems were reviewed and are negative except for that mentioned in the HPI    Physical Exam:   Temp:  [97 F (36.1 C)-98.5 F (36.9 C)] 97.3 F (36.3 C)  Heart Rate:  [68-85] 68  Resp Rate:  [15-18] 17  BP: (98-176)/(61-91) 114/61     Vital Signs:  Reviewed    General: The patient was well developed and well nourished.  No acute distress. Cooperative with the exam  Extremities: no pedal edema, extremities normal in color    Mental Status: The patient was asleep but easily arouseable. oriented to person and  place. Tracking. Following simple commands, some difficulty with 2 step commands. Affect is blunted   Fund of knowledge appropriate  Recent and remote memory are impaired   Attention span and concentration are diminished   Speech is clear, fluent.     Cranial nerves:   -CN II: Visual fields full to bedside confrontation   -CN III, IV, VI: Pupils equal, round, and reactive to light; extraocular movements intact; no ptosis              -CN V: Facial sensation intact in V1 through V3 distributions   -CN VII: Face symmetric   -CN VIII: Hearing intact to conversational speech   -CN IX, X: Normal phonation   -CN XI: Symmetric full strength of sternocleidomastoid and trapezius muscles   -CN XII: Tongue protrudes midline    Motor: Muscle tone normal without spasticity or flaccidity. No atrophy.  MAE with equal, symmetric strength     Sensory:   Light touch intact.  Temperature intact.      Reflexes:  R / L     R / L  Biceps  2 / 2  Knees  2 / 2  Triceps 2 / 2  Ankles  2 / 2  BR                   2 / 2  Plantars Flexor / Flexor    Coordination: FTN intact, no truncal ataxia. No tremors    Gait: Deferred       Labs:     Results     Procedure Component Value Units Date/Time    Comprehensive metabolic panel [[id number]]  (Abnormal) Collected:  07/15/16 0455    Specimen:  Blood Updated:  07/15/16 0544     Glucose 86 mg/dL      BUN 69.6 mg/dL      Creatinine 0.7 mg/dL      Sodium 295 mEq/L      Potassium 4.1 mEq/L      Chloride  111 mEq/L      CO2 20 (L) mEq/L      Calcium 8.9 mg/dL      Protein, Total 6.0 g/dL      Albumin 3.2 (L) g/dL      AST (SGOT) 14 U/L      ALT 7 U/L      Alkaline Phosphatase 38 U/L      Bilirubin, Total 0.4 mg/dL      Globulin 2.8 g/dL      Albumin/Globulin Ratio 1.1     Anion Gap 7.0    Hemolysis index [[id number]] Collected:  07/15/16 0455     Updated:  07/15/16 0544     Hemolysis Index 6    GFR [[id number]] Collected:  07/15/16 0455     Updated:  07/15/16 0544     EGFR >60.0    CBC [[id number]]  (Abnormal)  Collected:  07/15/16 0454    Specimen:  Blood from Blood Updated:  07/15/16 0510     WBC 5.18 x10 3/uL      Hgb 10.3 (L) g/dL      Hematocrit 84.6 (L) %      Platelets 286 x10 3/uL      RBC 4.02 (L) x10 6/uL      MCV 84.1 fL      MCH 25.6 (L) pg      MCHC 30.5 (L) g/dL      RDW 16 (H) %      MPV 10.2 fL      Nucleated RBC 0.0 /100 WBC      Absolute NRBC 0.00 x10 3/uL     Urine Tox Screen (Rapid Drug Screen) [[id number]] Collected:  07/14/16 2217    Specimen:  Urine Updated:  07/14/16 2239     Amphetamine Screen, UR Negative     Barbiturate Screen, UR Negative     Benzodiazepine Screen, UR Negative     Cannabinoid Screen, UR Negative     Cocaine, UR Negative     Opiate Screen, UR Negative     PCP Screen, UR Negative    UA with reflex to micro (pts  3 + yrs) [[id number]] Collected:  07/14/16 2217    Specimen:  Urine Updated:  07/14/16 2239     Urine Type Clean Catch     Color, UA Straw     Clarity, UA Hazy     Specific Gravity UA 1.010     Urine pH 7.0     Leukocyte Esterase, UA Negative     Nitrite, UA Negative     Protein, UR Negative     Glucose, UA Negative     Ketones UA Negative     Urobilinogen, UA 2.0 mg/dL      Bilirubin, UA Negative     Blood, UA Negative    Troponin I [[id number]] Collected:  07/14/16 2046    Specimen:  Blood Updated:  07/14/16 2118     Troponin I 0.01 ng/mL     Basic Metabolic Panel [[id number]]  (Abnormal) Collected:  07/14/16 2046    Specimen:  Blood Updated:  07/14/16 2110     Glucose 89 mg/dL      BUN 87.5 mg/dL      Creatinine 1.1 (H) mg/dL      Calcium 9.7 mg/dL      Sodium 643 mEq/L      Potassium 3.7 mEq/L      Chloride 107 mEq/L      CO2 22 mEq/L  Anion Gap 11.0    GFR [[id number]] Collected:  07/14/16 2046     Updated:  07/14/16 2110     EGFR >60.0    CBC with differential [[id number]]  (Abnormal) Collected:  07/14/16 2046    Specimen:  Blood from Blood Updated:  07/14/16 2100     WBC 8.08 x10 3/uL      Hgb 11.1 (L) g/dL      Hematocrit 74.2 (L) %      Platelets 344 x10 3/uL       RBC 4.33 x10 6/uL      MCV 83.1 fL      MCH 25.6 (L) pg      MCHC 30.8 (L) g/dL      RDW 16 (H) %      MPV 9.9 fL      Neutrophils 53.9 %      Lymphocytes Automated 34.5 %      Monocytes 8.7 %      Eosinophils Automated 2.2 %      Basophils Automated 0.6 %      Immature Granulocyte 0.1 %      Nucleated RBC 0.0 /100 WBC      Neutrophils Absolute 4.35 x10 3/uL      Abs Lymph Automated 2.79 x10 3/uL      Abs Mono Automated 0.70 x10 3/uL      Abs Eos Automated 0.18 x10 3/uL      Absolute Baso Automated 0.05 x10 3/uL      Absolute Immature Granulocyte 0.01 x10 3/uL      Absolute NRBC 0.00 x10 3/uL           Rads:     Results for orders placed or performed during the hospital encounter of 07/14/16   CT Head WO Contrast    Narrative    CT HEAD WO CONTRAST    CLINICAL INDICATION:  . HEAD TRAUMA, CLOSED, MOD-SEVERE    TECHNIQUE:  Noncontrast CT scan of the head was performed. Axial images  were obtained.  CT Dose reduction technique: One or more the following dose reduction  techniques were utilized: Automated exposure control; Adjustment of the  MVA and/or KVP according to patient's size; Use of the iterative  reconstruction technique.    FINDINGS:  The ventricles and cisterns are clear. No acute bleed. No  acute cortical ischemic abnormality. No mass effect or midline shift. No  gross abnormality in the posterior fossa; beam hardening artifacts are  seen.  No evidence for an acute intracranial abnormality.        Impression        NO ACUTE INTRACRANIAL ABNORMALITY.    [name], MD   07/14/2016 8:47 PM       Signed by:  Santo Held, FNP-BC  Nurse Practitioner   [facility]  Spectra: [facility] [phone]  [facility] [phone]    *Final recommendations per attending neurologist who will also see patient today and record notes.

## 2016-07-15 NOTE — Plan of Care (Signed)
Problem: Safety  Goal: Patient will be free from injury during hospitalization  Outcome: Progressing   07/15/16 2841   Goal/Interventions addressed this shift   Patient will be free from injury during hospitalization  Assess patient's risk for falls and implement fall prevention plan of care per policy;Provide and maintain safe environment;Ensure appropriate safety devices are available at the bedside;Hourly rounding;Include patient/ family/ care giver in decisions related to safety;Assess for patients risk for elopement and implement Elopement Risk Plan per policy;Provide alternative method of communication if needed (communication boards, writing)       Comments: Patient admitted to unit. Pt oriented to white board, room and unit. Care plan reviewed. Hourly rounding explained and conducted. Call bell and tray table within reach  Bed in lowest position with wheels locked. Nonskid footwear on. Walking path clear. Pt demonstrated proper use of call bell. Elopement risk assessed and documented.     Plan: Continue hourly rounding. Reassess fall risk. Keep whiteboard updated and work with pt on plan of care.      Pt drowsy upon admission. No admission paperwork signed. Pt c/o dizziness, blurry vision in right eye and muffled hearing in R ear. All CT's and XR's unremarkable. No episodes of nausea or vomiting after admission. Pt asked for sandwich and graham crackers butfell asleep without consuming. Pt assisted to toilet by CT. No c/o pain.     The learning abilities of the patient and/or caregiver have been assessed. Today's individualized plan of care includes PT, VTE, observation status and 24 hour telemetry.    The patient and/or caregiver states the following personal goal related to the patient's deficit(s): stop feeling dizzy  The patient and/or caregiver agree to the plan of care and demonstrate understanding of the disease process, treatment plan, medications, and consequences of noncompliance. All questions and  concerns were addressed.         Marland Kitchen

## 2016-07-15 NOTE — UM Notes (Signed)
Self Pay   Place (admit) for Observation Services (Order [id number]) 07/14/16 2200    Clinical Impression:   1. Altered mental status, unspecified altered mental status type   2. Injury of head, initial encounter   3. Dizziness   4. Elevated blood pressure reading   5. Neck pain   6. Intractable vomiting with nausea, unspecified vomiting type     Wendy Buckley is a 46 y.o. female presented to the ED with head injury while at work. 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 States denies fall but states she immediately felt dizzy. She reports she went to [employer] comp clinic and was there was this morning until 6:30p. States she was referred to ED. Per paperwork pt brought with her time of injury was 10am and she was found to have 2 knots on the right side of her head. Pt states someone drove her to ER.    BP 114/61   Pulse 68   Temp 97.3 F (36.3 C) (Oral)   Resp 17   Ht 1.676 m (5\' 6" )   Wt 56.9 kg (125 lb 8 oz)   LMP 06/14/2016   SpO2 100%   BMI 20.26 kg/m     Temp:  [97 F (36.1 C)-98.5 F (36.9 C)]   Heart Rate:  [68-85]   Resp Rate:  [15-18]   BP: (98-176)/(61-91)   SpO2:  [97 %-100 %]   Height:  [167.6 cm (5\' 6" )]   Weight:  [55.3 kg (122 lb)-56.9 kg (125 lb 8 oz)]   BMI (calculated):  [19.7-20.3]     Last recorded pain score:  Pain Scale Used: Numeric Scale (0-10)  Pain Score: 2-mild pain     EKG:  Interpreted by the EP.              Time Interpreted: 1953              Rate: 83              Rhythm: Normal Sinus Rhythm               Interpretation: no STEMI. Poor r-wave progression.              Comparison: Unchanged from 04/07/2015    Heart Score Total (with age)  1    Lab Results last 48 Hours     Procedure Component Value Units Date/Time    Comprehensive metabolic panel [[id number]]  (Abnormal) Collected:  07/15/16 0455     CO2 20 (L) mEq/L      Albumin 3.2 (L) g/dL     CBC [[id number]]  (Abnormal) Collected:  07/15/16 0454      Hgb 10.3 (L) g/dL      Hematocrit 71.0 (L) %      RBC 4.02 (L) x10 6/uL      MCH 25.6 (L) pg      MCHC 30.5 (L) g/dL      RDW 16 (H) %     Troponin I [[id number]] Collected:  07/14/16 2046     Troponin I 0.01 ng/mL     Basic Metabolic Panel [[id number]]  (Abnormal) Collected:  07/14/16 2046     Creatinine 1.1 (H) mg/dL     CBC with differential [[id number]]  (Abnormal) Collected:  07/14/16 2046     Hgb 11.1 (L) g/dL      Hematocrit 69.6 (L) %      MCH  25.6 (L) pg      MCHC 30.8 (L) g/dL      RDW 16 (H) %         MD note 07/15/16  ASSESSMENT & PLAN  ...a 46 y.o. female presenting with Mild TBI (traumatic brain injury).  # Mild traumatic brain injury: Likely concussion secondary to work-related head trauma.  Had imaging at outside hospital is unremarkable.  Patient has symptoms of dizziness and nausea with mild confusion/memory loss.  -Continue to monitor, if symptoms persist, consider follow-up head imaging with brain MRI  -Zofran for nausea.  -Ibuprofen for pain  #Head and neck pain: Secondary to work-related trauma  -Pain control with ibuprofen  #Hypertension: Likely pain related  -Pain control, defer antihypertensives at this time  # Nutrition  Regular diet, as tolerated  # VTE Prophylaxis  SCDs  # CODE STATUS: Full code  Anticipated medical stability for discharge: 1-2 Days  Service status/Reason for ongoing hospitalization: Observation for concussion  Anticipated Discharge Needs: None    Orders  MRI brain, EEG, VS, I/O's, SCD's, tele, PT    Scheduled Meds:  Current Facility-Administered Medications   Medication Dose Route Frequency   . diphenhydrAMINE  12.5 mg Intravenous Q8H   . ketorolac  30 mg Intravenous Q8H   . magnesium sulfate  1 g Intravenous Q12H SCH   . prochlorperazine  10 mg Intravenous Q8H     Continuous Infusions:  PRN Meds:.acetaminophen, ibuprofen, naloxone, ondansetron    Completed Meds  acetaminophen (TYLENOL) tablet 650 mg : Dose 650 mg : Oral : Once  ketorolac (TORADOL) injection 30 mg :  Dose 30 mg : Intravenous : Once  ondansetron (ZOFRAN-ODT) disintegrating tablet 4 mg : Dose 4 mg : Oral : Once  promethazine (PHENERGAN) injection 12.5 mg : Dose 12.5 mg : Intravenous : Once   sodium chloride 0.9 % bolus 1,000 mL : Dose 1,000 mL : 1,000 mL/hr : Intravenous : Once    Eddie North,  BSN, RN  Utilization Review Case Manager  Case Management Department  [facility]  T [phone] Judie Petit [phone]   [email or web address]  Please submit all clinical review request via fax to [phone]

## 2016-07-16 ENCOUNTER — Observation Stay: Payer: Worker's Comp, Other unspecified

## 2016-07-16 LAB — ECG 12-LEAD
Atrial Rate: 83 {beats}/min
P Axis: 58 degrees
P-R Interval: 190 ms
Q-T Interval: 380 ms
QRS Duration: 74 ms
QTC Calculation (Bezet): 446 ms
R Axis: 44 degrees
T Axis: 39 degrees
Ventricular Rate: 83 {beats}/min

## 2016-07-16 MED ORDER — GADOBUTROL 1 MMOL/ML IV SOLN
6.0000 mL | Freq: Once | INTRAVENOUS | Status: AC | PRN
Start: 2016-07-16 — End: 2016-07-16
  Administered 2016-07-16: 10:00:00 6 mmol via INTRAVENOUS

## 2016-07-16 MED ORDER — METHYLPREDNISOLONE 4 MG PO TABS
4.0000 mg | ORAL_TABLET | Freq: Every morning | ORAL | Status: DC
Start: 2016-07-17 — End: 2016-07-19
  Administered 2016-07-17 – 2016-07-19 (×3): 4 mg via ORAL
  Filled 2016-07-16 (×3): qty 1

## 2016-07-16 MED ORDER — METHYLPREDNISOLONE 4 MG PO TABS
8.0000 mg | ORAL_TABLET | Freq: Every morning | ORAL | Status: AC
Start: 2016-07-16 — End: 2016-07-16
  Administered 2016-07-16: 15:00:00 8 mg via ORAL
  Filled 2016-07-16: qty 2

## 2016-07-16 MED ORDER — HEPARIN SODIUM (PORCINE) 5000 UNIT/ML IJ SOLN
5000.0000 [IU] | Freq: Two times a day (BID) | INTRAMUSCULAR | Status: DC
Start: 2016-07-16 — End: 2016-07-26
  Administered 2016-07-19 – 2016-07-20 (×2): 5000 [IU] via SUBCUTANEOUS
  Filled 2016-07-16 (×14): qty 1

## 2016-07-16 MED ORDER — METHYLPREDNISOLONE 4 MG PO TABS
4.0000 mg | ORAL_TABLET | Freq: Every evening | ORAL | Status: DC
Start: 2016-07-18 — End: 2016-07-19
  Administered 2016-07-18: 22:00:00 4 mg via ORAL
  Filled 2016-07-16: qty 1

## 2016-07-16 MED ORDER — METHYLPREDNISOLONE 4 MG PO TABS
4.0000 mg | ORAL_TABLET | Freq: Every day | ORAL | Status: DC
Start: 2016-07-16 — End: 2016-07-19
  Administered 2016-07-16 – 2016-07-18 (×3): 4 mg via ORAL
  Filled 2016-07-16 (×3): qty 1

## 2016-07-16 MED ORDER — SODIUM CHLORIDE 0.9 % IV BOLUS
500.0000 mL | Freq: Once | INTRAVENOUS | Status: AC
Start: 2016-07-16 — End: 2016-07-16
  Administered 2016-07-16: 10:00:00 500 mL via INTRAVENOUS

## 2016-07-16 MED ORDER — METHYLPREDNISOLONE 4 MG PO TABS
4.0000 mg | ORAL_TABLET | Freq: Every day | ORAL | Status: DC
Start: 2016-07-16 — End: 2016-07-19
  Administered 2016-07-16 – 2016-07-17 (×2): 4 mg via ORAL
  Filled 2016-07-16 (×2): qty 1

## 2016-07-16 MED ORDER — GABAPENTIN 300 MG PO CAPS
300.0000 mg | ORAL_CAPSULE | Freq: Every evening | ORAL | Status: DC
Start: 2016-07-16 — End: 2016-07-17
  Administered 2016-07-16: 21:00:00 300 mg via ORAL
  Filled 2016-07-16: qty 1

## 2016-07-16 MED ORDER — METHYLPREDNISOLONE 4 MG PO TABS
8.0000 mg | ORAL_TABLET | Freq: Every evening | ORAL | Status: AC
Start: 2016-07-16 — End: 2016-07-17
  Administered 2016-07-16 – 2016-07-17 (×2): 8 mg via ORAL
  Filled 2016-07-16 (×2): qty 2

## 2016-07-16 MED ORDER — METHOCARBAMOL 500 MG PO TABS
500.0000 mg | ORAL_TABLET | Freq: Four times a day (QID) | ORAL | Status: DC | PRN
Start: 2016-07-16 — End: 2016-07-18
  Administered 2016-07-16 – 2016-07-17 (×3): 500 mg via ORAL
  Filled 2016-07-16 (×3): qty 1

## 2016-07-16 NOTE — Progress Notes (Signed)
Patient off the floor to MRI 

## 2016-07-16 NOTE — Progress Notes (Signed)
I have independently interviewed and examined the patient. I agree with this entire note with the following clarifications and exceptions. In summary:    I have personally reviewed relevant neuro-imaging that reveals MRI brain unremarkable  EEG negative.     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         Thank you for the consultation.    Lora Havens, MD [facility]  IM[facility] Neurology    IMG Neurology Consultation Progress Note    Date Time: 07/16/16 11:57 AM  Patient Name: [facility]  Outpatient Neurologist :    CC: Head injury, confusion           Assessment:   S/p head injury resulting in HA, dizziness, confusion/disorientation in a 46 y/o female with no significant PMH. MRI brain without signfiicant findings. rEEG without seizures. 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     Cannot completely rule out a post TBI seizure, as Seizure is a common sequelae of mild head injury.           Patient Active Problem List   Diagnosis   . CAP (community acquired pneumonia)   . Hypokalemia   . Hyperglycemia   . Cough   . Wheezing   . Altered mental status   . Mild TBI (traumatic brain injury)       Plan:   -MRI brain reviewed  -rEEG reviewed   -Will add Robaxin 500 mg QID PRN neck pain and a Medrol dose pack for headache   -Consider Neurontin for a possible associated occipital neuralgia   -PT/OT as needed  -Medical management per primary team  -No further neuro recommendations; she can f/u with IMG neurology in 2-4 weeks     Interval History/Subjective:   Pt endorsing R temporal headache and neck pain, currently 5/10, no true  alleviating factors. Per nursing, pt remains impulsive at times. On exam, she is oriented x3-4, following commands, asking to go home.   Denies weakness, dizziness, numbness, vision changes.       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 These findings likely incidental and unrelated to clinical picture  -rEEG No epileptiform discharges, focal slowing, spike waves or clinical events noted.      Medications:     Current Facility-Administered Medications   Medication Dose Route Frequency   . diphenhydrAMINE  12.5 mg Intravenous Q8H   . ketorolac  30 mg Intravenous Q8H   . magnesium sulfate  1 g Intravenous Q12H SCH   . prochlorperazine  10 mg Intravenous Q8H       Review of Systems:   Negative except for what is mentioned in the interval/subjective section       Physical Exam:   Temp:  [97.2 F (36.2 C)-98.5 F (36.9 C)] 98.1 F (36.7 C)  Heart Rate:  [62-77] 72  Resp Rate:  [16-18] 16  BP: (94-114)/(43-63) 105/60    Vital Signs:  Reviewed    General: The patient was well developed and well nourished.  No acute distress. Cooperative with the exam  Extremities: no pedal edema, extremities normal in color  Mental Status: The patient was awake, alert and oriented to person, time, president. She stated she thinks shes at "doctors office"  Affect is blunted   Fund of knowledge appropriate  Recent memory impaired and remote memory intact   Attention span and concentration appear normal.  Language function is normal. There is no evidence of aphasia in conversational speech. Follows commands     Cranial nerves:   -CN II: Visual fields full to bedside confrontation   -CN III, IV, VI: Pupils equal, round, and reactive to light; extraocular movements intact; no ptosis             -CN VII: Face symmetric   -CN VIII: Hearing intact to conversational speech   -CN IX, X: Normal phonation   -CN XI: Symmetric full strength of sternocleidomastoid and trapezius muscles        Motor: Muscle tone normal without spasticity or flaccidity. No atrophy.  No pronator drift.  Strength essentially 5/5     Sensory:   Light touch intact.      Reflexes:  Deferred     Coordination: FTN intact. No tremors    Gait: Station normal, gait stable       Labs:     Results     Procedure Component Value Units Date/Time    TSH [[id number]] Collected:  07/15/16 0454    Specimen:  Blood Updated:  07/15/16 1536     TSH 1.87 uIU/mL           Rads:   Chest 2 Views    Result Date: 07/14/2016       NO SIGNIFICANT ABNORMALITY. [name], MD 07/14/2016 8:43 PM    Ct Head Wo Contrast    Result Date: 07/14/2016      NO ACUTE INTRACRANIAL ABNORMALITY. [name], MD 07/14/2016 8:47 PM    Ct Cervical Spine Wo Contrast    Result Date: 07/14/2016    SPASM. NO ACUTE FRACTURE. [name], MD 07/14/2016 8:49 PM    Mri Brain W Wo Contrast    Result Date: 07/16/2016   There are a few small nonspecific foci of hyperintensity in the white matter of the right and left cerebral hemispheres. 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 Please review above. [name], MD 07/16/2016 10:17 AM    Shoulder Right 2+ Views    Result Date: 07/14/2016   NO SIGNIFICANT ABNORMALITY. [name], MD 07/14/2016 8:33 PM        Signed by:  Santo Held, FNP-BC  Nurse Practitioner  [facility]  Spectra: [facility] [phone]  [facility] [phone]    *Final recommendations per attending neurologist who will also see patient today and record notes.

## 2016-07-16 NOTE — Plan of Care (Signed)
Problem: Neurological Deficit  Goal: Neurological status is stable or improving  Outcome: Progressing   07/16/16 0603   Goal/Interventions addressed this shift   Neurological status is stable or improving Monitor/assess/document neurological assessment (Stroke: every 4 hours);Observe for seizure activity and initiate seizure precautions if indicated;Perform CAM Assessment     Pt a&O x 2-3 during shift. Pt oriented to place, time, and person. Stated that she was in the hospital and she came in because of her head. Re-oriented to situation. Pt c/o pain during shift when awoken for neuro checks and interventions. Pt c/o dizziness, nausea, seeing double around 0430, MD notified. No orders given. Administered zofran tablet, pt tolerated well. Pt slept most of shift. MRI consent form and questionnaire done and sent to MRI. MRI not done. Pt brother came during the beginning of shift and was updated. Assisted in helping fill out paperwork, stated that no previous AMS has occurred. VSS during shift, SBP mid 90s, will continue to monitor

## 2016-07-16 NOTE — Progress Notes (Signed)
CM submitted STAT request per charge RN request.    Noel Christmas, LGSW  [phone]

## 2016-07-16 NOTE — UM Notes (Signed)
Self Pay  CSR 07/16/16    Wendy Buckley is a 46 y.o. female admitted with Mild TBI (traumatic brain injury)    BP 93/55   Pulse 76   Temp 98.4 F (36.9 C) (Oral)   Resp 16   Ht 1.676 m (5\' 6" )   Wt 57.1 kg (125 lb 12.8 oz)   LMP 06/14/2016   SpO2 100%   BMI 20.30 kg/m     Temp:  [97.2 F (36.2 C)-98.5 F (36.9 C)]   Heart Rate:  [62-77]   Resp Rate:  [16-18]   BP: (93-110)/(43-61)   SpO2:  [97 %-100 %]   Weight:  [57.1 kg (125 lb 12.8 oz)]     Last recorded pain score:  Pain Scale Used: Numeric Scale (0-10)  Pain Score: 5-moderate pain     MD note 07/16/16  Patient Active Hospital Problem List:   Mild TBI (traumatic brain injury): concussion 2/2 work related trauma. Significant dizziness with confusion and memory loss - both are improving, though patient still significant unsteadiness on her feet, impulsiveness, odd affect. MRI brain without acute findings  - PT/OT eval -... concerned that the patient may not be safe to be home at this time.   - antiemetics and pain control  Headache: 2/2 trauma  - conservative pain management  HTN: improving, suspect related to pain  - continue to monitor  Nutrition: Reg  DVT Prophylaxis: SCDs, will start heparin now post MRI  Code Status: Full Code    Neurology note 07/16/16  Assessment:  S/p head injury resulting in HA, dizziness, confusion/disorientation in a 46 y/o female with no significant PMH. MRI brain without signfiicant findings. rEEG without seizures. 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   Cannot completely rule out a post TBI seizure, as Seizure is a common sequelae of mild head  injury.  Plan:  -MRI brain reviewed  -rEEG reviewed   -Will add Robaxin 500 mg QID PRN neck pain and a Medrol dose pack for headache   -Consider Neurontin for a possible associated occipital neuralgia   -PT/OT as needed  -Medical management per primary team  -No further neuro recommendations; she can f/u with IMG neurology in 2-4 weeks     Scheduled Meds:  Current Facility-Administered Medications   Medication Dose Route Frequency   . gabapentin  300 mg Oral QHS   . heparin (porcine)  5,000 Units Subcutaneous Q12H SCH   . magnesium sulfate  1 g Intravenous Q12H SCH   . methylPREDNISolone  4 mg Oral QD after lunch   . methylPREDNISolone  4 mg Oral QD after dinner   . [START ON 07/17/2016] methylPREDNISolone  4 mg Oral QAM AC   . [START ON 07/18/2016] methylPREDNISolone  4 mg Oral QHS   . methylPREDNISolone  8 mg Oral QHS     Continuous Infusions:  PRN Meds:.acetaminophen 650 mg po q 4h x 3 , ibuprofen, methocarbamol, naloxone, ondansetron 4 mg po q 6h x 1 , ondansetron    Eddie North,  BSN, RN  Utilization Review Case Manager  Case Management Department  [facility] [address]  T [phone] Judie Petit [phone]   [email or web address]  Please submit all clinical review  request via fax to [phone]

## 2016-07-16 NOTE — Procedures (Signed)
Indication / History: The patient has h/o Dizziness, / EMU    This electroencephalogram was recorded using both referential and differential montages. Using a digital machine the 18 channel International 10-20 System of electrode placement was used.    Medications listed include__      Background: The EEG was recorded with the patient awake, drowsy and asleep. The waking background activity in the occipital lobe consists of fairly well developed medium voltage 10-11 hz. activity that attenuates with eye opening. Some eye movement artifact and low voltage fast activity is seen frontally.   Drowsiness heralded by disorganization and slowing of the background. Stage II sleep was obtained with symmetric spindle activity and vertex waves.     Abnormal activity : No epileptiform discharges, focal slowing, spike waves or clinical events noted.  Hemispheric asymmetry : none    Photic stimulation and hyperventilation produced no abnormality.    Limited one lead EKG recording showed NSR @  /m, and no arrhythmias seen.    IMPRESSION: mildly abnormal EEG because of generalized excessive beta activity could be medication effect. NO evidence for seizures    Rosalio Macadamia, MD - [place]  NEUROLOGY  Available on XTEND paging  Board Certified in Neurology by ABPN  Board Certified Clinical Neurophysiology by ABPN
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
## 2016-07-16 NOTE — Plan of Care (Addendum)
Problem: Safety  Goal: Patient will be free from injury during hospitalization   07/16/16 1542   Goal/Interventions addressed this shift   Patient will be free from injury during hospitalization  Provide and maintain safe environment;Assess patient's risk for falls and implement fall prevention plan of care per policy;Ensure appropriate safety devices are available at the bedside;Include patient/ family/ care giver in decisions related to safety;Hourly rounding   Patient noted to be impulsive. Instructed to call for assistance but doesn't follow instruction. Safe environment provided. Bed alarm on and RVM for safety. SBP 90's discussed with Dr Kerrin Mo. NS 500 ml bolus given    Problem: Pain  Goal: Pain at adequate level as identified by patient  Outcome: Progressing   07/16/16 1542   Goal/Interventions addressed this shift   Pain at adequate level as identified by patient Identify patient comfort function goal;Reassess pain within 30-60 minutes of any procedure/intervention, per Pain Assessment, Intervention, Reassessment (AIR) Cycle;Evaluate if patient comfort function goal is met;Evaluate patient's satisfaction with pain management progress;Offer non-pharmacological pain management interventions;Consult/collaborate with Physical Therapy, Occupational Therapy, and/or Speech Therapy;Include patient/patient care companion in decisions related to pain management as needed   Complained of headache. Medicated with tylenol, on IV magnesium but patient not relieved. Patient started on medrol and ice pack applied.

## 2016-07-16 NOTE — Plan of Care (Signed)
Problem: Physical Therapy  Goal: By discharge, patient will perform mobility at the patient's highest functional potential. See PT evaluation/note for goals.  Outcome: Progressing  Discharge Recommendation: Acute Rehab, Other (Comment) (if unavailable, home with supervision during waking hrs and HH PT/OT/speech)  DME Recommended for Discharge: Other (Comment) (none currently)    Is an Occupational Therapy Evaluation Indicated at this time? Yes, an OT evaluation is indicated at this time.    Treatment/Interventions: Exercise, Gait training, LE strengthening/ROM, Cognitive reorientation, Patient/family training, Neuromuscular re-education, Equipment eval/education, Continued evaluation  PT Frequency: 3-4x/wk     PMP Activity: Step 6 - Walks in Room  Distance Walked (ft) (Step 6,7): 10 Feet (+ 10 ft)  (Please See Therapy Evaluation for device and assistance level needed)    Goals:   Goals  Goal Formulation: With patient  Time for Goal Acheivement: By time of discharge  Goals: Select goal  Pt Will Perform Sit to Stand: independent, to maximize functional mobility and independence, Partly met  Pt Will Transfer Bed/Chair: independent, to maximize functional mobility and independence, Not met  Pt Will Ambulate: 151-200 feet, independent, to maximize functional mobility and independence, Partly met

## 2016-07-16 NOTE — Progress Notes (Signed)
CM recv'd a return call from the pt's dtr confirming she does live w/ a brother. Her dtr and another sister live in [place]. Caller stated the pt works at night and has had a couple of falls in the past. The caller stated she has limited info abt the pt's PMH but she'll try to contact the pt's brother to assist w/ d/c planning.    Noel Christmas, LGSW  [phone]

## 2016-07-16 NOTE — Progress Notes (Signed)
SOUND HOSPITAL[facility]  PROGRESS NOTE      Patient: Wendy Buckley  Date: 07/16/2016   LOS: 0 Days  Admission Date: 07/14/2016   MRN: [id number]  Attending: Elna Breslow  Please page me at [phone]       ASSESSMENT/PLAN     Wendy Buckley is a 46 y.o. 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 Significant dizziness with confusion and memory loss - both are improving, though patient still significant unsteadiness on her feet, impulsiveness, odd affect. MRI brain without acute findings  - PT/OT eval - I am concerned that the patient may not be safe to be home at this time.   - antiemetics and pain control    Headache: 2/2 trauma  - conservative pain management    HTN: improving, suspect related to pain  - continue to monitor    Nutrition: Reg    DVT Prophylaxis: SCDs, will start heparin now post MRI    Code Status: Full Code           SUBJECTIVE     No acute events overnight. She says she feels better but is still having headache    MEDICATIONS     Current Facility-Administered Medications   Medication Dose Route Frequency   . magnesium sulfate  1 g Intravenous Q12H SCH   . methylPREDNISolone  4 mg Oral QD after lunch   . methylPREDNISolone  4 mg Oral QD after dinner   . [START ON 07/17/2016] methylPREDNISolone  4 mg Oral QAM AC   . [START ON 07/18/2016] methylPREDNISolone  4 mg Oral QHS   . methylPREDNISolone  8 mg Oral QHS       PHYSICAL EXAM     Vitals:    07/16/16 1547   BP: 93/55   Pulse: 76   Resp: 16   Temp: 98.4 F (36.9 C)   SpO2: 100%       Temperature: Temp  Min: 97.2 F (36.2 C)  Max: 98.5 F (36.9 C)  Pulse: Pulse  Min: 62  Max: 77  Respiratory: Resp  Min: 16  Max: 18  Non-Invasive BP: BP  Min: 93/55  Max: 110/61  Pulse Oximetry SpO2  Min: 97 %  Max: 100 %    Intake and Output Summary (Last 24 hours) at Date Time    Intake/Output Summary (Last 24 hours) at 07/16/16  1655  Last data filed at 07/16/16 0200   Gross per 24 hour   Intake              260 ml   Output                0 ml   Net              260 ml       GEN APPEARANCE: Patient resting in bed in NAD  HEENT: No scleral icterus, MMM  CVS: RRR, S1, S2; No M/G/R  LUNGS: CTAB; No Wheezes; No Rhonchi: No rales  ABD: Soft; No TTP; + Normoactive BS, no rebound or guarding  EXT: WWP,   Skin exam:  No gross lesions noted on exposed skin surfaces  MENTAL STATUS:  Answers questions appropriately, responds to commands - but is very slow to answer, at times forgetful. Needs to be frequently re-oriented. Cranial nerve exam:  EOMI, CN V sensory/motor intact, no  facial asymmetry noted, patient able to hear, uvula midline. No gross motor or sensory deficits          LABS       Recent Labs  Lab 07/15/16  0454 07/14/16  2046   WBC 5.18 8.08   RBC 4.02* 4.33   Hgb 10.3* 11.1*   Hematocrit 33.8* 36.0*   MCV 84.1 83.1   Platelets 286 344         Recent Labs  Lab 07/15/16  0455 07/14/16  2046   Sodium 138 140   Potassium 4.1 3.7   Chloride 111 107   CO2 20* 22   BUN 14.0 18.0   Creatinine 0.7 1.1*   Glucose 86 89   Calcium 8.9 9.7         Recent Labs  Lab 07/15/16  0455   ALT 7   AST (SGOT) 14   Bilirubin, Total 0.4   Albumin 3.2*   Alkaline Phosphatase 38         Recent Labs  Lab 07/14/16  2046   Troponin I 0.01             Microbiology Results     None           RADIOLOGY     Chest 2 Views    Result Date: 07/14/2016       NO SIGNIFICANT ABNORMALITY. [name], MD 07/14/2016 8:43 PM    Ct Head Wo Contrast    Result Date: 07/14/2016      NO ACUTE INTRACRANIAL ABNORMALITY. [name], MD 07/14/2016 8:47 PM    Ct Cervical Spine Wo Contrast    Result Date: 07/14/2016    SPASM. NO ACUTE FRACTURE. [name], MD 07/14/2016 8:49 PM    Mri Brain W Wo Contrast    Result Date: 07/16/2016   There are a few small nonspecific foci of hyperintensity in the white matter of the right and left cerebral hemispheres. 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 Please review above. [name], MD 07/16/2016 10:17 AM    Shoulder Right 2+ Views    Result Date: 07/14/2016   NO SIGNIFICANT ABNORMALITY. [name], MD 07/14/2016 8:33 PM        Signed,  Wilburn Cornelia Kayo Zion  4:55 PM 07/16/2016

## 2016-07-17 ENCOUNTER — Inpatient Hospital Stay: Payer: Worker's Comp, Other unspecified

## 2016-07-17 LAB — BASIC METABOLIC PANEL
Anion Gap: 8 (ref 5.0–15.0)
BUN: 15 mg/dL (ref 7.0–19.0)
CO2: 21 mEq/L — ABNORMAL LOW (ref 22–29)
Calcium: 9.3 mg/dL (ref 8.5–10.5)
Chloride: 108 mEq/L (ref 100–111)
Creatinine: 0.9 mg/dL (ref 0.6–1.0)
Glucose: 104 mg/dL — ABNORMAL HIGH (ref 70–100)
Potassium: 4.4 mEq/L (ref 3.5–5.1)
Sodium: 137 mEq/L (ref 136–145)

## 2016-07-17 LAB — CBC AND DIFFERENTIAL
Absolute NRBC: 0 10*3/uL
Basophils Absolute Automated: 0.02 10*3/uL (ref 0.00–0.20)
Basophils Automated: 0.2 %
Eosinophils Absolute Automated: 0 10*3/uL (ref 0.00–0.70)
Eosinophils Automated: 0 %
Hematocrit: 36.5 % — ABNORMAL LOW (ref 37.0–47.0)
Hgb: 11.3 g/dL — ABNORMAL LOW (ref 12.0–16.0)
Immature Granulocytes Absolute: 0.06 10*3/uL — ABNORMAL HIGH
Immature Granulocytes: 0.5 %
Lymphocytes Absolute Automated: 1.56 10*3/uL (ref 0.50–4.40)
Lymphocytes Automated: 13.6 %
MCH: 26.2 pg — ABNORMAL LOW (ref 28.0–32.0)
MCHC: 31 g/dL — ABNORMAL LOW (ref 32.0–36.0)
MCV: 84.7 fL (ref 80.0–100.0)
MPV: 9.8 fL (ref 9.4–12.3)
Monocytes Absolute Automated: 0.79 10*3/uL (ref 0.00–1.20)
Monocytes: 6.9 %
Neutrophils Absolute: 9.07 10*3/uL — ABNORMAL HIGH (ref 1.80–8.10)
Neutrophils: 78.8 %
Nucleated RBC: 0 /100 WBC (ref 0.0–1.0)
Platelets: 340 10*3/uL (ref 140–400)
RBC: 4.31 10*6/uL (ref 4.20–5.40)
RDW: 16 % — ABNORMAL HIGH (ref 12–15)
WBC: 11.5 10*3/uL — ABNORMAL HIGH (ref 3.50–10.80)

## 2016-07-17 LAB — GFR: EGFR: >60

## 2016-07-17 LAB — HEMOLYSIS INDEX: Hemolysis Index: 10 (ref 0–18)

## 2016-07-17 MED ORDER — GABAPENTIN 300 MG PO CAPS
600.0000 mg | ORAL_CAPSULE | Freq: Every evening | ORAL | Status: DC
Start: 2016-07-17 — End: 2016-07-18
  Administered 2016-07-17: 22:00:00 600 mg via ORAL
  Filled 2016-07-17: qty 2

## 2016-07-17 MED ORDER — FAMOTIDINE 20 MG PO TABS
20.0000 mg | ORAL_TABLET | Freq: Two times a day (BID) | ORAL | Status: DC
Start: 2016-07-17 — End: 2016-07-28
  Administered 2016-07-17 – 2016-07-28 (×21): 20 mg via ORAL
  Filled 2016-07-17 (×23): qty 1

## 2016-07-17 MED ORDER — SODIUM CHLORIDE 0.9 % IV SOLN
INTRAVENOUS | Status: AC
Start: 2016-07-17 — End: 2016-07-18

## 2016-07-17 NOTE — Plan of Care (Signed)
Problem: Neurological Deficit  Goal: Neurological status is stable or improving  Outcome: Progressing   07/17/16 1610   Goal/Interventions addressed this shift   Neurological status is stable or improving Monitor/assess/document neurological assessment (Stroke: every 4 hours);Observe for seizure activity and initiate seizure precautions if indicated;Perform CAM Assessment     Neuro status improving, a&o x 4. Safety awareness also improving, pt called prior to getting up to go to [facility]. Pt continues to c/o headache pain, tylenol and ibuprofen administered with minimal relief. Pt refused to finish Mg+ ggt. Applied ice packs to head. Pt was unable to sleep until around 0300. Pt VSS, NSR on tele. Will continue to monitor.

## 2016-07-17 NOTE — Progress Notes (Signed)
Admitted due to head injury. Worker's Comp following. Pt denies any PMH other than carpel tunnel on her L hand,and giving birth to her dtr. Pt denies any hx of SNF/HH/DME supports. She identifies her brother as her HDM. She is receptive to AR but is anxious to go home.       07/17/16 1701   Patient Type   Within 30 Days of Previous Admission? No   Healthcare Decisions   Interviewed: Patient   Orientation/Decision Making Abilities of Patient Alert and Oriented x3, able to make decisions   Advance Directive Patient does not have advance directive   Healthcare Agent Appointed Yes   Healthcare Agent's Name Wendy Buckley brother   Healthcare Agent's Phone Number [phone]   Prior to admission   Prior level of function Independent with ADLs;Ambulates independently   Type of Residence Private residence   Living Arrangements Family members   How do you get to your MD appointments? self   How do you get your groceries? self   Who fixes your meals? self   Who does your laundry? self   Who picks up your prescriptions? self   Dressing Independent   Grooming Independent   Feeding Independent   Bathing Independent   Toileting Independent   Name of Prior Assisted Living Facility N/a   Home Care/Community Services None   Prior SNF admission? (Detail) N/A   Prior Rehab admission? (Detail) N/A   Adult Protective Services (APS) involved? No   Discharge Planning   Support Systems Children;Family members   Patient expects to be discharged to: home v. ARF   Anticipated [place] plan discussed with: Same as interviewed   Mode of transportation: Private car (family member)   Consults/Providers   Outcome Palliative Care Screen Screened but did not meet criteria for intervention   Correct PCP listed in Epic? No (comment)   Important Message from Medicare Notice   Patient received 1st IMM Letter? n/a

## 2016-07-17 NOTE — Progress Notes (Signed)
I have independently interviewed and examined the patient. I agree with this entire note with the following clarifications and exceptions. In summary:    I have personally reviewed relevant neuro-imaging that reveals MRI brain negative    Significant headache today but wants to go home. After discussion with primary team, likely discharge tomorrow morning.     Neurontin to rx headache.         Thank you for the consultation.    Lora Havens, MD [facility]  [facility]    [facility] Consultation Progress Note    Date Time: 07/17/16 11:14 AM  Patient Name: [facility]  Outpatient Neurologist :    CC: Head injury, confusion           Assessment:   S/p head injury resulting in HA, dizziness, confusion/disorientation in a 46 y/o female with no significant PMH. MRI brain without signfiicant findings. rEEG without seizures. 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           Patient Active Problem List   Diagnosis   . CAP (community acquired pneumonia)   . Hypokalemia   . Hyperglycemia   . Cough   . Wheezing   . Altered mental status   . Mild TBI (traumatic brain injury)       Plan:   -Continue Robaxin 500 mg QID PRN neck pain and a Medrol dose pack for headache   -Increase Neurontin q600 qhs (ordered)  -PT/OT as needed  -Medical management per primary team  -OK to d/c from a neuro standpoint. she can f/u with [facility] in 2-4 weeks     Interval History/Subjective:   Remains with a R temporal headache and neck pain, currently 5/10, no true alleviating factors. 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 These findings likely incidental and unrelated to clinical picture  rEEG No epileptiform discharges, focal slowing, spike waves or clinical events noted.      Medications:     Current  Facility-Administered Medications   Medication Dose Route Frequency   . gabapentin  600 mg Oral QHS   . heparin (porcine)  5,000 Units Subcutaneous Q12H [facility]   . methylPREDNISolone  4 mg Oral QD after lunch   . methylPREDNISolone  4 mg Oral QD after dinner   . methylPREDNISolone  4 mg Oral QAM AC   . [START ON 07/18/2016] methylPREDNISolone  4 mg Oral QHS   . methylPREDNISolone  8 mg Oral QHS       Review of Systems:   Negative except for what is mentioned in the interval/subjective section       Physical Exam:   Temp:  [96.8 F (36 C)-98.4 F (36.9 C)] 97.1 F (36.2 C)  Heart Rate:  [72-83] 83  Resp Rate:  [16-18] 17  BP: (93-130)/(52-65) 96/52    Vital Signs:  Reviewed    General: The patient was well developed and well nourished.  No acute distress. Cooperative with the exam  Extremities: no pedal edema, extremities normal in color    Mental Status: The patient was awake, alert and oriented.  Affect is blunted   Fund of knowledge intatct  Attention span and concentration appear normal.  Speech clear, fluent.  Follows commands     Cranial nerves:   -  CN II: Visual fields full to bedside confrontation   -CN III, IV, VI: Pupils equal, round, and reactive to light; extraocular movements intact; no ptosis             -CN VIII: Hearing intact to conversational speech   -CN IX, X: Normal phonation       Motor: Muscle tone normal.  Strength essentially 5/5     Sensory:   Light touch intact.    Coordination: No tremors    Gait:Deferred       Labs:     Results     ** No results found for the last 24 hours. **          Rads:   Chest 2 Views    Result Date: 07/14/2016       NO SIGNIFICANT ABNORMALITY. [name], MD 07/14/2016 8:43 PM    Ct Head Wo Contrast    Result Date: 07/14/2016      NO ACUTE INTRACRANIAL ABNORMALITY. [name], MD 07/14/2016 8:47 PM    Ct Cervical Spine Wo Contrast    Result Date: 07/14/2016    SPASM. NO ACUTE FRACTURE. [name], MD 07/14/2016 8:49 PM    Mri Brain W Wo Contrast    Result Date:  07/16/2016   There are a few small nonspecific foci of hyperintensity in the white matter of the right and left cerebral hemispheres. 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 Please review above. [name], MD 07/16/2016 10:17 AM    Shoulder Right 2+ Views    Result Date: 07/14/2016   NO SIGNIFICANT ABNORMALITY. [name], MD 07/14/2016 8:33 PM        Signed by:  Santo Held, FNP-BC  Nurse Practitioner   [facility]  Spectra: [facility] [phone]  [facility] [phone]    *Final recommendations per attending neurologist who will also see patient today and record notes.

## 2016-07-17 NOTE — PT Progress Note (Signed)
Physical Therapy Note    [facility]  Physical Therapy Attempt Note    Patient:  Wendy Buckley MRN#:  [id number]  Unit:  [address] [facility] Room/Bed:  V4098/J1914-N      Physical Therapy treatment attempted on 07/17/2016, unable to complete secondary to pt politely declining. Pt reports 7/10 headache and 5/10 L elbow pain. Pt requesting to have PT session tomorrow.      RN notified. Will follow up on another date as able.          Rexene Edison license # [id number]    Physical Medicine and Rehabilitation  [facility]  [phone]    07/17/2016  2:28 PM

## 2016-07-17 NOTE — UM Notes (Signed)
Self Pay  Admit to Inpatient (Order [id number]) 07/17/16 1133    Wendy Buckley is a 46 y.o. female admitted with Mild TBI (traumatic brain injury). Pt c/o headache not improved at all. Dizzy and nauseous in the am.    BP 104/51   Pulse 84   Temp 97.8 F (36.6 C) (Oral)   Resp 17   Ht 1.676 m (5\' 6" )   Wt 56.9 kg (125 lb 8 oz)   LMP 06/14/2016   SpO2 98%   BMI 20.26 kg/m     Temp:  [96.8 F (36 C)-98.3 F (36.8 C)]   Heart Rate:  [75-84]   Resp Rate:  [17-18]   BP: (96-130)/(51-72)   SpO2:  [96 %-99 %]   Weight:  [56.9 kg (125 lb 8 oz)]     Last recorded pain score:  Pain Scale Used: Numeric Scale (0-10)  Pain Score: 6-moderate pain     Results     Procedure Component Value Units Date/Time    Basic Metabolic Panel [[id number]]  (Abnormal) Collected:  07/17/16 1334    Specimen:  Blood Updated:  07/17/16 1410     Glucose 104 (H) mg/dL      CO2 21 (L) mEq/L     CBC and differential [[id number]]  (Abnormal) Collected:  07/17/16 1334     WBC 11.50 (H) x10 3/uL      Hgb 11.3 (L) g/dL      Hematocrit 08.6 (L) %      MCH 26.2 (L) pg      MCHC 31.0 (L) g/dL      RDW 16 (H) %      Neutrophils Absolute 9.07 (H) x10 3/uL      Absolute Immature Granulocyte 0.06 (H) x10 3/uL         MD note 07/17/16  Patient Active Hospital Problem List:   Mild TBI (traumatic brain injury): concussion 2/2 work related trauma. Significant dizziness with confusion and memory loss - both are improving, though patient still significant unsteadiness on her feet, impulsiveness, odd affect. MRI brain without acute findings  - PT/OT eval - I am concerned that the patient may not be safe to be home at this time.   - antiemetics and pain control  Headache: 2/2 trauma  -continues to c/o headache and nausea  -pt is dizzy  -check orthostatics  -IVF   -conservative pain management  -Neurontin increased per Neuro  HTN: improving, suspect related to pain  - continue to monitor  Await input from brother  Nutrition: Reg  DVT Prophylaxis: SCDs, will  start heparin now post MRI  Code Status: Full Code    Orders  I/O's, SCD's, tele, orthstatic bp, PT  Scheduled Meds:  Current Facility-Administered Medications   Medication Dose Route Frequency   . famotidine  20 mg Oral Q12H SCH   . gabapentin  600 mg Oral QHS   . heparin (porcine)  5,000 Units Subcutaneous Q12H [facility]   . methylPREDNISolone  4 mg Oral QD after lunch   . methylPREDNISolone  4 mg Oral QD after dinner   . methylPREDNISolone  4 mg Oral QAM AC   . [START ON 07/18/2016] methylPREDNISolone  4 mg Oral QHS   . methylPREDNISolone  8 mg Oral QHS     Continuous Infusions:  . sodium chloride 75 mL/hr at 07/17/16 1339     PRN Meds:.acetaminophen 650 mg po q 4 hr x 3 , ibuprofen 400 mg po q 4hr x 1, methocarbamol 500 mg  po qid x 2 , naloxone, ondansetron po 4 mg po q 6 hr x 2 , ondansetron    Eddie North,  BSN, RN  Utilization Review Case Manager  Case Management Department  [facility]  T [phone] Judie Petit [phone]   [email or web address]  Please submit all clinical review request via fax to [phone]

## 2016-07-17 NOTE — Plan of Care (Signed)
Problem: Safety  Goal: Patient will be free from injury during hospitalization  Outcome: Progressing   07/17/16 1123   Goal/Interventions addressed this shift   Patient will be free from injury during hospitalization  Assess patient's risk for falls and implement fall prevention plan of care per policy;Provide and maintain safe environment;Ensure appropriate safety devices are available at the bedside;Include patient/ family/ care giver in decisions related to safety;Hourly rounding   Patient ambulate with standby assist. Cooperative and impulsive behavior noted. Safe environment provided. RVM discontinued for now.    Problem: Pain  Goal: Pain at adequate level as identified by patient  Outcome: Progressing   07/17/16 1123   Goal/Interventions addressed this shift   Pain at adequate level as identified by patient Identify patient comfort function goal;Assess pain on admission, during daily assessment and/or before any "as needed" intervention(s);Reassess pain within 30-60 minutes of any procedure/intervention, per Pain Assessment, Intervention, Reassessment (AIR) Cycle;Evaluate if patient comfort function goal is met;Evaluate patient's satisfaction with pain management progress;Offer non-pharmacological pain management interventions;Consult/collaborate with Physical Therapy, Occupational Therapy, and/or Speech Therapy;Include patient/patient care companion in decisions related to pain management as needed   Still complained of headache and dizziness. Medrol pack given as ordered. Also medicated with zofran for nausea.     Problem: Impaired Mobility  Goal: Mobility/Activity is maintained at optimal level for patient  Outcome: Progressing   07/17/16 1123   Goal/Interventions addressed this shift   Mobility/activity is maintained at optimal level for patient Increase mobility as tolerated/progressive mobility;Encourage independent activity per ability;Consult/collaborate with Physical Therapy and/or Occupational  Therapy   Patient A/O x3-4 still doesn't know why she's here when asked. Wants to go home. Verbalized lives with the brother but was not to get in touched with him. Dr Theadora Rama made rounds and discussed POC with patient.

## 2016-07-17 NOTE — Progress Notes (Signed)
Worker's Comp claim [id number]    ARF ref'l submitted. Assess't pending.    Noel Christmas, LGSW  [phone]

## 2016-07-17 NOTE — Progress Notes (Signed)
SOUND HOSPITALIST  PROGRESS NOTE      Patient: Wendy Buckley  Date: 07/17/2016   LOS: 0 Days  Admission Date: 07/14/2016   MRN: [id number]  Attending: Laretta Bolster  Please page me at [phone]       ASSESSMENT/PLAN     Wendy Buckley is a 46 y.o. 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 Significant dizziness with confusion and memory loss - both are improving, though patient still significant unsteadiness on her feet, impulsiveness, odd affect. MRI brain without acute findings  - PT/OT eval - I am concerned that the patient may not be safe to be home at this time.   - antiemetics and pain control    Headache: 2/2 trauma  -continues to c/o headache and nausea  -pt is dizzy  -check orthostatics  -IVF   -conservative pain management  -Neurontin increased per Neuro    HTN: improving, suspect related to pain  - continue to monitor  Await input from brother    Nutrition: Reg    DVT Prophylaxis: SCDs, will start heparin now post MRI    Code Status: Full Code           SUBJECTIVE     C/o headache, not improved at all.  Dizzy  Nauseous this am    MEDICATIONS     Current Facility-Administered Medications   Medication Dose Route Frequency   . gabapentin  600 mg Oral QHS   . heparin (porcine)  5,000 Units Subcutaneous Q12H [facility]   . methylPREDNISolone  4 mg Oral QD after lunch   . methylPREDNISolone  4 mg Oral QD after dinner   . methylPREDNISolone  4 mg Oral QAM AC   . [START ON 07/18/2016] methylPREDNISolone  4 mg Oral QHS   . methylPREDNISolone  8 mg Oral QHS       PHYSICAL EXAM     Vitals:    07/17/16 0800   BP: 96/52   Pulse: 83   Resp: 17   Temp: 97.1 F (36.2 C)   SpO2: 96%       Temperature: Temp  Min: 96.8 F (36 C)  Max: 98.4 F (36.9 C)  Pulse: Pulse  Min: 72  Max: 83  Respiratory: Resp  Min: 16  Max: 18  Non-Invasive BP: BP  Min: 93/55  Max: 130/65  Pulse Oximetry SpO2  Min: 96 %   Max: 100 %    Intake and Output Summary (Last 24 hours) at Date Time    Intake/Output Summary (Last 24 hours) at 07/17/16 1133  Last data filed at 07/17/16 0932   Gross per 24 hour   Intake             1210 ml   Output                0 ml   Net             1210 ml       GEN APPEARANCE: Patient resting in bed in NAD  HEENT: No scleral icterus, MMM  CVS: RRR, S1, S2; No M/G/R  LUNGS: CTAB; No Wheezes; No Rhonchi: No rales  ABD: Soft; No TTP; + Normoactive BS, no rebound or guarding  EXT: WWP,   Skin exam:  No gross lesions noted on exposed skin surfaces  MENTAL STATUS:  Answers questions appropriately, responds  to commands - very slow to answer, at times forgetful. Needs to be frequently re-oriented. Cranial nerve exam:  EOMI, CN V sensory/motor intact, no facial asymmetry noted, patient able to hear, uvula midline. No gross motor or sensory deficits          LABS       Recent Labs  Lab 07/15/16  0454 07/14/16  2046   WBC 5.18 8.08   RBC 4.02* 4.33   Hgb 10.3* 11.1*   Hematocrit 33.8* 36.0*   MCV 84.1 83.1   Platelets 286 344         Recent Labs  Lab 07/15/16  0455 07/14/16  2046   Sodium 138 140   Potassium 4.1 3.7   Chloride 111 107   CO2 20* 22   BUN 14.0 18.0   Creatinine 0.7 1.1*   Glucose 86 89   Calcium 8.9 9.7         Recent Labs  Lab 07/15/16  0455   ALT 7   AST (SGOT) 14   Bilirubin, Total 0.4   Albumin 3.2*   Alkaline Phosphatase 38         Recent Labs  Lab 07/14/16  2046   Troponin I 0.01             Microbiology Results     None           RADIOLOGY     Chest 2 Views    Result Date: 07/14/2016       NO SIGNIFICANT ABNORMALITY. [name], MD 07/14/2016 8:43 PM    Ct Head Wo Contrast    Result Date: 07/14/2016      NO ACUTE INTRACRANIAL ABNORMALITY. [name], MD 07/14/2016 8:47 PM    Ct Cervical Spine Wo Contrast    Result Date: 07/14/2016    SPASM. NO ACUTE FRACTURE. [name], MD 07/14/2016 8:49 PM    Mri Brain W Wo Contrast    Result Date: 07/16/2016   There are a few small nonspecific foci of  hyperintensity in the white matter of the right and left cerebral hemispheres. 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 Please review above. [name], MD 07/16/2016 10:17 AM    Shoulder Right 2+ Views    Result Date: 07/14/2016   NO SIGNIFICANT ABNORMALITY. [name], MD 07/14/2016 8:33 PM        Signed,  Laretta Bolster  11:33 AM 07/17/2016

## 2016-07-17 NOTE — Progress Notes (Signed)
CM spoke w/ pt who indicated her admission will be covered by [employer]. She is waiting to recv the claim number. CM will submit ref'l to ARF in the interim.    Noel Christmas, LGSW  [phone]

## 2016-07-18 MED ORDER — OXYCODONE-ACETAMINOPHEN 5-325 MG PO TABS
1.0000 | ORAL_TABLET | ORAL | Status: DC | PRN
Start: 2016-07-18 — End: 2016-07-26
  Administered 2016-07-18 – 2016-07-24 (×7): 1 via ORAL
  Filled 2016-07-18 (×7): qty 1

## 2016-07-18 MED ORDER — DIPHENHYDRAMINE HCL 25 MG PO CAPS
25.0000 mg | ORAL_CAPSULE | Freq: Every evening | ORAL | Status: DC
Start: 2016-07-18 — End: 2016-07-18

## 2016-07-18 MED ORDER — DIPHENHYDRAMINE HCL 25 MG PO CAPS
25.0000 mg | ORAL_CAPSULE | Freq: Every evening | ORAL | Status: DC
Start: 2016-07-18 — End: 2016-07-20
  Administered 2016-07-18 – 2016-07-19 (×3): 25 mg via ORAL
  Filled 2016-07-18 (×3): qty 1

## 2016-07-18 MED ORDER — METHOCARBAMOL 500 MG PO TABS
500.0000 mg | ORAL_TABLET | Freq: Four times a day (QID) | ORAL | Status: DC
Start: 2016-07-19 — End: 2016-07-24
  Administered 2016-07-19 – 2016-07-23 (×14): 500 mg via ORAL
  Filled 2016-07-18 (×15): qty 1

## 2016-07-18 MED ORDER — SODIUM CHLORIDE 0.9 % IV SOLN
24.0000 mg | Freq: Once | INTRAVENOUS | Status: AC
Start: 2016-07-18 — End: 2016-07-18
  Administered 2016-07-18: 17:00:00 24 mg via INTRAVENOUS
  Filled 2016-07-18: qty 2.4

## 2016-07-18 MED ORDER — METHOCARBAMOL 1000 MG/10ML IJ SOLN
1500.0000 mg | Freq: Once | INTRAVENOUS | Status: AC
Start: 2016-07-18 — End: 2016-07-18
  Administered 2016-07-18: 17:00:00 1500 mg via INTRAVENOUS
  Filled 2016-07-18: qty 15

## 2016-07-18 MED ORDER — GABAPENTIN 400 MG PO CAPS
400.0000 mg | ORAL_CAPSULE | Freq: Four times a day (QID) | ORAL | Status: DC
Start: 2016-07-18 — End: 2016-07-19
  Administered 2016-07-18 – 2016-07-19 (×4): 400 mg via ORAL
  Filled 2016-07-18 (×4): qty 1

## 2016-07-18 MED ORDER — METOCLOPRAMIDE HCL 5 MG/ML IJ SOLN
10.0000 mg | Freq: Once | INTRAMUSCULAR | Status: AC
Start: 2016-07-18 — End: 2016-07-18
  Administered 2016-07-18: 18:00:00 10 mg via INTRAVENOUS
  Filled 2016-07-18: qty 2

## 2016-07-18 MED ORDER — DIPHENHYDRAMINE HCL 50 MG/ML IJ SOLN
25.0000 mg | Freq: Once | INTRAMUSCULAR | Status: AC
Start: 2016-07-18 — End: 2016-07-18
  Administered 2016-07-18: 17:00:00 25 mg via INTRAVENOUS
  Filled 2016-07-18: qty 1

## 2016-07-18 NOTE — Plan of Care (Signed)
Problem: Pain  Goal: Pain at adequate level as identified by patient   07/18/16 1543   Goal/Interventions addressed this shift   Pain at adequate level as identified by patient Identify patient comfort function goal;Assess for risk of opioid induced respiratory depression, including snoring/sleep apnea. Alert healthcare team of risk factors identified.;Reassess pain within 30-60 minutes of any procedure/intervention, per Pain Assessment, Intervention, Reassessment (AIR) Cycle;Evaluate if patient comfort function goal is met;Evaluate patient's satisfaction with pain management progress;Offer non-pharmacological pain management interventions;Assess pain on admission, during daily assessment and/or before any "as needed" intervention(s)       Problem: Neurological Deficit  Goal: Neurological status is stable or improving   07/18/16 1543   Goal/Interventions addressed this shift   Neurological status is stable or improving Monitor/assess/document neurological assessment (Stroke: every 4 hours);Observe for seizure activity and initiate seizure precautions if indicated;Perform CAM Assessment   Patient is alert and oriented with periods of confusion noted.  No distress noted.  Patient complained of headache this am and requested prn pain medication which was effective per pt on reassessment.  Plan of care discussed with patient and all questions answered.  Trio rounding done with Dr. Theadora Rama.  Patient voiced that concerns about medication and discussing with neurology.  RN paged neurology for patient, Oncoming Neurologist Dr. Derrill Kay will call back.   Safety precautions in place and safety maintained. Will continue with plan of care.

## 2016-07-18 NOTE — Progress Notes (Addendum)
ADDENDUM:  On call - patient's daughter reports patient with severe headache.   Rx'd migraine cocktail - decadron, benadryl, zofran, reglan, robaxini 1500mg   Increased gabapentin to 400mg  qid  Scheduled robaxin 500mg  qid (starting tomorrow).  --------------------------------    Neurology progress note inpatient    HPI:   History was obtained from patient     46 y.o. year-old female whose headache is much better this morning. Tolerating neurontin 600mg  qHS well. Continue robaxin and finish medrol dose pack.      Past Medical, Surgical, Social, Family History: Per Epic. Reviewed and updated. Also see scanned in sheet that accompanies this record (which was reviewed prior to scanning)    Med / Surg - History reviewed. No pertinent past medical history.  Soc-   Social History     Social History   . Marital status: Married     Spouse name: N/A   . Number of children: N/A   . Years of education: N/A     Occupational History   . Not on file.     Social History Main Topics   . Smoking status: Never Smoker   . Smokeless tobacco: Never Used   . Alcohol use No   . Drug use: No   . Sexual activity: Not on file     Other Topics Concern   . Not on file     Social History Narrative    Works at [employer] at home with husband      Fam- Noncontributory  Family History   Problem Relation Age of Onset   . Ovarian cancer Mother        Review of Systems   Constitutional: Negative for malaise/fatigue.   HENT: Negative.    Eyes: Negative.    Respiratory: Negative.    Cardiovascular: Negative.    Gastrointestinal: Negative.    Genitourinary: Negative.    Musculoskeletal: Positive for myalgias.   Skin: Negative.    Neurological: Positive for headaches.   Endo/Heme/Allergies: Negative.    Psychiatric/Behavioral: Negative.      All other systems reviewed and negative    Meds:  No current facility-administered medications on file prior to encounter.      No current outpatient prescriptions on file prior to encounter.        EXAM:  Visit Vitals  BP 116/65   Pulse 67   Temp 97 F (36.1 C) (Oral)   Resp 16   Ht 1.676 m (5\' 6" )   Wt 55.6 kg (122 lb 8 oz)   LMP 06/14/2016   SpO2 99%   BMI 19.77 kg/m     Respiratory rate normal    General: Well developed and well nourished in no acute distress. There is no icterus or cyanosis or pedal edema. Peripheral pulses are intact.   Heart S1 S2 RRR  Lungs clear no wheezes  Abdomen soft, not tender  Skin: no rashes on extremities or joint swelling  Neck: not tender, normal ROM    Psychiatric. Cooperative. Thought content and process normal. Mood good. Affect congruent.     Mental Status: The patient was awake, alert, appeared oriented and was appropriate. Speech was fluent and the patient followed commands well. Attention, concentration, and memory appeared intact. Fund of knowledge appeared appropriate for education level.     Cranial Nerves: Pupils were equally round and reactive to light bilaterally. Extraocular movements were intact without nystagmus. Hearing was intact to conversational speech. Face was symmetric. Tongue appeared midline.  Motor: grossly 5/5 throughout. Bulk appeared normal for body habitus. No obvious tremor noted.    Reflex: 2+ throughout    Sensation: light touch was grossly intact.     Coordination: Finger to nose testing was intact without dysmetria.     Gait: Normal and steady.    Imaging reports were reviewed, and images were personally reviewed. It shows MRI brain unremarkable white matter changes, within normal range. DVA is an incidental finding..    Labs were reviewed: bmp/cbc reviewed. Elevated wbc due to prednisone.      ASSESSMENT / PLAN  Follow up in neurology clinic    1. Migraine, chronic, persistent. Causing neck pain, nausea, ovmiting.  2. Postconcussive syndrome, contributing to AMS that is improving  3. TBI    OK to discharge  Finish medrol dose pack  Gabapentin 600mg  qHS  Robaxin 500mg  qid prn muscle spasm  Zofran PRN  Discontinue opiates - these  will worsen her headache  Avoid tylenol or ibuprofen > 2 days a week. Use additional gabapentin or robaxin if headaches are severe    Follow up with [facility]        ICD-10-CM    1. Altered mental status, unspecified altered mental status type R41.82    2. Injury of head, initial encounter S09.90XA    3. Dizziness R42    4. Elevated blood pressure reading R03.0    5. Neck pain M54.2    6. Intractable vomiting with nausea, unspecified vomiting type R11.2        Orders Placed This Encounter   Procedures   . CT Head WO Contrast   . CT Cervical Spine WO Contrast   . Chest 2 Views   . Shoulder Right 2+ Views   . MRI Brain W WO Contrast   . XR Elbow Left AP Lateral And Obliques   . CBC with differential   . Basic Metabolic Panel   . Troponin I   . GFR   . UA with reflex to micro (pts  3 + yrs)   . Urine Tox Screen (Rapid Drug Screen)   . Comprehensive metabolic panel   . CBC   . Hemolysis index   . GFR   . TSH   . CBC and differential   . Basic Metabolic Panel   . Hemolysis index   . GFR   . Diet regular   . Vital Signs-Repeat   . Ambulate Patient   . ED Holding Orders Expire in 24 Hours   . Pulse Oximetry   . Progressive Mobility Protocol   . Notify physician   . I/O   . Height   . Weight   . Skin assessment   . Education: Activity   . Education: Disease Process & Condition   . Education: Pain Management   . Education: Falls Risk   . Education: Smoking Cessation   . Place sequential compression device   . Maintain sequential compression device   . Orthostatic blood pressure   . Telemetry 24 Hour Protocol   . Full Code   . PT EVALUATE AND TREAT   . ECG 12 Lead   . CARDIAC STRIPS   . EKG SCAN   . EEG   . Saline lock IV   . Place (admit) for Observation Services   . Admit to Inpatient   . [facility] ED Bed Request (Observation)       Please contact me with any questions. Patients and [facility] Providers can reach me via  MyChart.    Elain Wixon H. Theodoro Grist, MD, PhD  [facility]    [place]: [phone]  [place]: [phone]

## 2016-07-18 NOTE — Progress Notes (Signed)
Patient refused am vitals.  RN expressed the importance of Vitals signs but patient continued to refuse.

## 2016-07-18 NOTE — Progress Notes (Signed)
Report given to floor nurse Dinesh who assumed care of the patient. Patient resting quietly in bed.

## 2016-07-18 NOTE — Plan of Care (Signed)
Problem: Pain  Goal: Pain at adequate level as identified by patient  Outcome: Progressing   07/18/16 0005   Goal/Interventions addressed this shift   Pain at adequate level as identified by patient Identify patient comfort function goal;Assess for risk of opioid induced respiratory depression, including snoring/sleep apnea. Alert healthcare team of risk factors identified.;Assess pain on admission, during daily assessment and/or before any "as needed" intervention(s);Reassess pain within 30-60 minutes of any procedure/intervention, per Pain Assessment, Intervention, Reassessment (AIR) Cycle;Evaluate if patient comfort function goal is met;Evaluate patient's satisfaction with pain management progress;Offer non-pharmacological pain management interventions;Consult/collaborate with Pain Service;Include patient/patient care companion in decisions related to pain management as needed;Consult/collaborate with Physical Therapy, Occupational Therapy, and/or Speech Therapy     Patient complains of headache and shoulder pain. Robaxin given but is of little effect. Will update MD.  Will continue to monitor patients status.     Problem: Neurological Deficit  Goal: Neurological status is stable or improving  Outcome: Progressing   07/18/16 0005   Goal/Interventions addressed this shift   Neurological status is stable or improving Monitor/assess/document neurological assessment (Stroke: every 4 hours);Observe for seizure activity and initiate seizure precautions if indicated;Perform CAM Assessment   Patient is alert times four. No confusion observed at this time. Patient is compliant with calling for assistance when needed.   Patient ambulates at a steady pace. Neuro checks done. Patient needs assessed and met. Will continue to monitor.

## 2016-07-18 NOTE — Progress Notes (Addendum)
[facility]  PROGRESS NOTE      Patient: Wendy Buckley  Date: 07/18/2016   LOS: 1 Days  Admission Date: 07/14/2016   MRN: [id number]  Attending: Laretta Bolster  Please page me at [phone]       ASSESSMENT/PLAN     Wendy Buckley is a 46 y.o. 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 Significant dizziness with confusion and memory loss - both are improving, though patient still significant unsteadiness on her feet, impulsiveness, odd affect. MRI brain without acute findings  - PT/OT     Headache: 2/2 trauma  -continues to c/o headache and nausea, not feeling any better  -conservative pain management  -Neurontin increased per Neuro  -follow neuro recs - IV Decadron started    HTN: improving, suspect related to pain  - continue to monitor  Await input from brother    Nutrition: Reg    DVT Prophylaxis: SCDs, Heparin sq    Code Status: Full Code           SUBJECTIVE     C/o headache, not better at all, using eyescreen.    MEDICATIONS     Current Facility-Administered Medications   Medication Dose Route Frequency   . dexamethasone  24 mg Intravenous Once   . diphenhydrAMINE  25 mg Oral QHS   . famotidine  20 mg Oral Q12H SCH   . gabapentin  400 mg Oral QID   . heparin (porcine)  5,000 Units Subcutaneous Q12H [facility]   . methocarbamol (ROBAXIN) IVPB  1,500 mg Intravenous Once   . [START ON 07/19/2016] methocarbamol  500 mg Oral QID   . methylPREDNISolone  4 mg Oral QD after lunch   . methylPREDNISolone  4 mg Oral QD after dinner   . methylPREDNISolone  4 mg Oral QAM AC   . methylPREDNISolone  4 mg Oral QHS   . metoclopramide  10 mg Intravenous Once       PHYSICAL EXAM     Vitals:    07/18/16 1500   BP: 120/70   Pulse:    Resp: 16   Temp: 97.2 F (36.2 C)   SpO2: 98%       Temperature: Temp  Min: 97 F (36.1 C)  Max: 98.4 F (36.9 C)  Pulse: Pulse  Min: 67  Max: 81  Respiratory: Resp   Min: 16  Max: 19  Non-Invasive BP: BP  Min: 116/65  Max: 133/64  Pulse Oximetry SpO2  Min: 97 %  Max: 99 %    Intake and Output Summary (Last 24 hours) at Date Time    Intake/Output Summary (Last 24 hours) at 07/18/16 1704  Last data filed at 07/18/16 0600   Gross per 24 hour   Intake              735 ml   Output                0 ml   Net              735 ml       GEN APPEARANCE: Patient resting in bed in NAD  HEENT: No scleral icterus, MMM  CVS: RRR, S1, S2; No M/G/R  LUNGS: CTAB; No Wheezes; No Rhonchi: No rales  ABD: Soft; No TTP; + Normoactive BS, no rebound or guarding  EXT: WWP,  Skin exam:  No gross lesions noted on exposed skin surfaces  MENTAL STATUS:  Answers questions appropriately, responds to commands - very slow to answer, at times forgetful. Needs to be frequently re-oriented. Cranial nerve exam:  EOMI, CN V sensory/motor intact, no facial asymmetry noted, patient able to hear, uvula midline. No gross motor or sensory deficits          LABS       Recent Labs  Lab 07/17/16  1334 07/15/16  0454 07/14/16  2046   WBC 11.50* 5.18 8.08   RBC 4.31 4.02* 4.33   Hgb 11.3* 10.3* 11.1*   Hematocrit 36.5* 33.8* 36.0*   MCV 84.7 84.1 83.1   Platelets 340 286 344         Recent Labs  Lab 07/17/16  1334 07/15/16  0455 07/14/16  2046   Sodium 137 138 140   Potassium 4.4 4.1 3.7   Chloride 108 111 107   CO2 21* 20* 22   BUN 15.0 14.0 18.0   Creatinine 0.9 0.7 1.1*   Glucose 104* 86 89   Calcium 9.3 8.9 9.7         Recent Labs  Lab 07/15/16  0455   ALT 7   AST (SGOT) 14   Bilirubin, Total 0.4   Albumin 3.2*   Alkaline Phosphatase 38         Recent Labs  Lab 07/14/16  2046   Troponin I 0.01             Microbiology Results     None           RADIOLOGY     Chest 2 Views    Result Date: 07/14/2016       NO SIGNIFICANT ABNORMALITY. [name], MD 07/14/2016 8:43 PM    Xr Elbow Left Ap Lateral And Obliques    Result Date: 07/17/2016   1. Small joint effusion otherwise negative [name], MD 07/17/2016 5:06 PM    Ct  Head Wo Contrast    Result Date: 07/14/2016      NO ACUTE INTRACRANIAL ABNORMALITY. [name], MD 07/14/2016 8:47 PM    Ct Cervical Spine Wo Contrast    Result Date: 07/14/2016    SPASM. NO ACUTE FRACTURE. [name], MD 07/14/2016 8:49 PM    Mri Brain W Wo Contrast    Result Date: 07/16/2016   There are a few small nonspecific foci of hyperintensity in the white matter of the right and left cerebral hemispheres. 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 Please review above. [name], MD 07/16/2016 10:17 AM    Shoulder Right 2+ Views    Result Date: 07/14/2016   NO SIGNIFICANT ABNORMALITY. [name], MD 07/14/2016 8:33 PM        Signed,  Laretta Bolster  5:04 PM 07/18/2016

## 2016-07-19 MED ORDER — DIPHENHYDRAMINE HCL 50 MG/ML IJ SOLN
25.0000 mg | Freq: Two times a day (BID) | INTRAMUSCULAR | Status: DC
Start: 2016-07-19 — End: 2016-07-20
  Administered 2016-07-19 – 2016-07-20 (×2): 25 mg via INTRAVENOUS
  Filled 2016-07-19 (×2): qty 1

## 2016-07-19 MED ORDER — VALPROATE SODIUM 500 MG/5ML IV SOLN
2000.0000 mg | Freq: Once | INTRAVENOUS | Status: DC
Start: 2016-07-19 — End: 2016-07-19

## 2016-07-19 MED ORDER — SODIUM CHLORIDE 0.9 % IV SOLN
INTRAVENOUS | Status: DC
Start: 2016-07-19 — End: 2016-07-22

## 2016-07-19 MED ORDER — LIDOCAINE 5 % EX OINT
TOPICAL_OINTMENT | Freq: Three times a day (TID) | CUTANEOUS | Status: DC
Start: 2016-07-19 — End: 2016-07-28
  Filled 2016-07-19: qty 35.44

## 2016-07-19 MED ORDER — DOCUSATE SODIUM 100 MG PO CAPS
100.0000 mg | ORAL_CAPSULE | Freq: Two times a day (BID) | ORAL | Status: DC
Start: 2016-07-19 — End: 2016-07-23
  Administered 2016-07-19 – 2016-07-23 (×7): 100 mg via ORAL
  Filled 2016-07-19 (×9): qty 1

## 2016-07-19 MED ORDER — VALPROATE SODIUM 100 MG/ML IV SOLN
2000.0000 mg | Freq: Once | INTRAVENOUS | Status: AC
Start: 2016-07-19 — End: 2016-07-19
  Administered 2016-07-19: 14:00:00 2000 mg via INTRAVENOUS
  Filled 2016-07-19: qty 20

## 2016-07-19 MED ORDER — METOCLOPRAMIDE HCL 5 MG/ML IJ SOLN
10.0000 mg | Freq: Two times a day (BID) | INTRAMUSCULAR | Status: DC
Start: 2016-07-19 — End: 2016-07-20
  Administered 2016-07-19 – 2016-07-20 (×2): 10 mg via INTRAVENOUS
  Filled 2016-07-19 (×2): qty 2

## 2016-07-19 MED ORDER — POLYETHYLENE GLYCOL 3350 17 G PO PACK
17.0000 g | PACK | Freq: Every day | ORAL | Status: DC | PRN
Start: 2016-07-19 — End: 2016-07-28
  Administered 2016-07-21: 22:00:00 17 g via ORAL
  Filled 2016-07-19 (×2): qty 1

## 2016-07-19 MED ORDER — POLYETHYLENE GLYCOL 3350 17 G PO PACK
17.0000 g | PACK | Freq: Once | ORAL | Status: AC
Start: 2016-07-19 — End: 2016-07-19
  Administered 2016-07-19: 16:00:00 17 g via ORAL
  Filled 2016-07-19: qty 1

## 2016-07-19 MED ORDER — ZOLPIDEM TARTRATE 5 MG PO TABS
5.0000 mg | ORAL_TABLET | Freq: Every evening | ORAL | Status: DC | PRN
Start: 2016-07-19 — End: 2016-07-28
  Administered 2016-07-19 – 2016-07-27 (×7): 5 mg via ORAL
  Filled 2016-07-19 (×7): qty 1

## 2016-07-19 MED ORDER — SODIUM CHLORIDE 0.9 % IV SOLN
12.0000 mg | Freq: Two times a day (BID) | INTRAVENOUS | Status: DC
Start: 2016-07-19 — End: 2016-07-20
  Administered 2016-07-19: 21:00:00 12 mg via INTRAVENOUS
  Filled 2016-07-19 (×2): qty 1.2

## 2016-07-19 MED ORDER — GABAPENTIN 400 MG PO CAPS
800.0000 mg | ORAL_CAPSULE | Freq: Three times a day (TID) | ORAL | Status: DC
Start: 2016-07-19 — End: 2016-07-23
  Administered 2016-07-19 – 2016-07-23 (×8): 800 mg via ORAL
  Filled 2016-07-19 (×11): qty 2

## 2016-07-19 NOTE — Plan of Care (Addendum)
Problem: Neurological Deficit  Goal: Neurological status is stable or improving  Outcome: Progressing   07/19/16 0439   Goal/Interventions addressed this shift   Neurological status is stable or improving Monitor/assess/document neurological assessment (Stroke: every 4 hours)   Miss Algeo still has complain HA, nausea, and vomiting. She had two minor episodes of vomiting during the shift (it was mostly mucous only), VSS, she was mostly calm and pleasant to this RN and team.

## 2016-07-19 NOTE — Plan of Care (Signed)
Problem: Altered GI Function  Goal: Elimination patterns are normal or improving  Outcome: Progressing   07/19/16 0443   Goal/Interventions addressed this shift   Elimination patterns are normal or improving Report abnormal assessment to physician;Anticipate/assist with toileting needs;Assess for normal bowel sounds;Monitor for abdominal distension;Monitor for abdominal discomfort;Assess for signs and symptoms of bleeding. Report signs of bleeding to physician   Wendy Buckley has a complain of constipation and vomiting, she mentioned having last bowel movement few days ago (10th July- but not a great historian/forgetful), hospitalist on duty was paged, haven't seen any order yet

## 2016-07-19 NOTE — Plan of Care (Signed)
Problem: Pain  Goal: Pain at adequate level as identified by patient  Outcome: Progressing   07/19/16 1900   Goal/Interventions addressed this shift   Pain at adequate level as identified by patiente Identify patient comfort function goal;Reassess pain within 30-60 minutes of any procedure/intervention, per Pain Assessment, Intervention, Reassessment (AIR) Cycle;Evaluate if patient comfort function goal is met;Evaluate patient's satisfaction with pain management progress;Include patient/patient care companion in decisions related to pain management as needed;Offer non-pharmacological pain management interventions       Problem: Altered GI Function  Goal: Elimination patterns are normal or improving  Outcome: Progressing   07/19/16 1900   Goal/Interventions addressed this shift   Elimination patterns are normal or improving Anticipate/assist with toileting needs;Assess for normal bowel sounds;Monitor for abdominal distension;Monitor for abdominal discomfort;Administer treatments as ordered;Consult/collaborate with Clinical Nutritionist;Reinforce education on foods that improve and complicate bowel movements and how activity and medications can affect bowel movements;Administer medications to improve bowel evacuation as prescribed;Encourage /perform oral hygiene as appropriate       Comments: Pt is AO x 4. Pt was complaining of nausea and vomited twice during the shift. Pt has not had a bowel movement yet, notified Dr. Theadora Rama and gave colace and miralax. Trio rounding with Dr Theadora Rama and plan of care discussed. All questions and concerns addressed. Trio rounding with Dr Theodoro Grist neurologist and discussed about the plan of care. Safety precautions in place and safety maintained. Will continue with plan of care.

## 2016-07-19 NOTE — Progress Notes (Addendum)
[facility]  PROGRESS NOTE      Patient: Wendy Buckley  Date: 07/19/2016   LOS: 2 Days  Admission Date: 07/14/2016   MRN: [id number]  Attending: Laretta Bolster  Please page me at [phone]       ASSESSMENT/PLAN     Wendy Buckley is a 46 y.o. 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 Significant dizziness with confusion and memory loss - both are improving, though patient still significant unsteadiness on her feet, impulsiveness, odd affect. MRI brain without acute findings  - PT/OT     Headache: 2/2 trauma  -continues to c/o headache and nausea - vomited x 2  -conservative pain management  -IV Decadron was given yesterday  -Neurology following - appreciate recs  -repeat orthostatics  --resume IVF    HTN: improving, suspect related to pain  - continue to monitor    Pt requesting Ambien    Nutrition: Reg    DVT Prophylaxis: SCDs, Heparin sq    Code Status: Full Code           SUBJECTIVE     C/o headache, not better at all  Vomited x 2  Could not sleep last night    MEDICATIONS     Current Facility-Administered Medications   Medication Dose Route Frequency   . diphenhydrAMINE  25 mg Oral QHS   . famotidine  20 mg Oral Q12H SCH   . gabapentin  400 mg Oral QID   . heparin (porcine)  5,000 Units Subcutaneous Q12H [facility]   . methocarbamol  500 mg Oral QID   . methylPREDNISolone  4 mg Oral QD after lunch   . methylPREDNISolone  4 mg Oral QD after dinner   . methylPREDNISolone  4 mg Oral QAM AC   . methylPREDNISolone  4 mg Oral QHS       PHYSICAL EXAM     Vitals:    07/19/16 0659   BP: 135/81   Pulse: 69   Resp: 18   Temp: 97.1 F (36.2 C)   SpO2: 98%       Temperature: Temp  Min: 97 F (36.1 C)  Max: 97.6 F (36.4 C)  Pulse: Pulse  Min: 67  Max: 87  Respiratory: Resp  Min: 16  Max: 18  Non-Invasive BP: BP  Min: 109/66  Max: 135/81  Pulse Oximetry SpO2  Min: 98 %  Max: 99 %    Intake  and Output Summary (Last 24 hours) at Date Time    Intake/Output Summary (Last 24 hours) at 07/19/16 1055  Last data filed at 07/19/16 0600   Gross per 24 hour   Intake             1070 ml   Output                0 ml   Net             1070 ml       GEN APPEARANCE: A and Ox3, uses eyescreen  HEENT: No scleral icterus, MMM  CVS: RRR, S1, S2; No M/G/R  LUNGS: CTAB; No Wheezes; No Rhonchi: No rales  ABD: Soft; No TTP; + Normoactive BS, no rebound or guarding  EXT: WWP,   Skin exam:  No gross lesions noted on exposed skin surfaces  MENTAL STATUS:  Answers questions appropriately, responds to  commands - very slow to answer, at times forgetful. Needs to be frequently re-oriented. Cranial nerve exam:  EOMI, CN V sensory/motor intact, no facial asymmetry noted, patient able to hear, uvula midline. No gross motor or sensory deficits          LABS       Recent Labs  Lab 07/17/16  1334 07/15/16  0454 07/14/16  2046   WBC 11.50* 5.18 8.08   RBC 4.31 4.02* 4.33   Hgb 11.3* 10.3* 11.1*   Hematocrit 36.5* 33.8* 36.0*   MCV 84.7 84.1 83.1   Platelets 340 286 344         Recent Labs  Lab 07/17/16  1334 07/15/16  0455 07/14/16  2046   Sodium 137 138 140   Potassium 4.4 4.1 3.7   Chloride 108 111 107   CO2 21* 20* 22   BUN 15.0 14.0 18.0   Creatinine 0.9 0.7 1.1*   Glucose 104* 86 89   Calcium 9.3 8.9 9.7         Recent Labs  Lab 07/15/16  0455   ALT 7   AST (SGOT) 14   Bilirubin, Total 0.4   Albumin 3.2*   Alkaline Phosphatase 38         Recent Labs  Lab 07/14/16  2046   Troponin I 0.01             Microbiology Results     None           RADIOLOGY     Chest 2 Views    Result Date: 07/14/2016       NO SIGNIFICANT ABNORMALITY. [name], MD 07/14/2016 8:43 PM    Xr Elbow Left Ap Lateral And Obliques    Result Date: 07/17/2016   1. Small joint effusion otherwise negative [name], MD 07/17/2016 5:06 PM    Ct Head Wo Contrast    Result Date: 07/14/2016      NO ACUTE INTRACRANIAL ABNORMALITY. [name], MD 07/14/2016 8:47 PM    Ct  Cervical Spine Wo Contrast    Result Date: 07/14/2016    SPASM. NO ACUTE FRACTURE. [name], MD 07/14/2016 8:49 PM    Mri Brain W Wo Contrast    Result Date: 07/16/2016   There are a few small nonspecific foci of hyperintensity in the white matter of the right and left cerebral hemispheres. 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 Please review above. [name], MD 07/16/2016 10:17 AM    Shoulder Right 2+ Views    Result Date: 07/14/2016   NO SIGNIFICANT ABNORMALITY. [name], MD 07/14/2016 8:33 PM        Signed,  Laretta Bolster  10:55 AM 07/19/2016

## 2016-07-19 NOTE — Progress Notes (Signed)
CC: headache, tbi    HPI:   History was obtained from patient     46 y.o. year-old female with ongoing headache. She obtained relief from cocktail last ngiht. However, headache recurred this morning. It is severe, located right posterior and radidating forwards. Worse if she lays her head on the right side. Associated with nausea, light sensitivity. Also located on temples. She c/o of left elbow pain radiating down the ulnar nerve distribution.     Past Medical, Surgical, Social, Family History: Per Epic. Reviewed and updated. Also see scanned in sheet that accompanies this record (which was reviewed prior to scanning)    Med / Surg - History reviewed. No pertinent past medical history.  Soc-   Social History     Social History   . Marital status: Married     Spouse name: N/A   . Number of children: N/A   . Years of education: N/A     Occupational History   . Not on file.     Social History Main Topics   . Smoking status: Never Smoker   . Smokeless tobacco: Never Used   . Alcohol use No   . Drug use: No   . Sexual activity: Not on file     Other Topics Concern   . Not on file     Social History Narrative    Works at [employer] at home with husband      Fam- Noncontributory  Family History   Problem Relation Age of Onset   . Ovarian cancer Mother        Review of Systems   Constitutional: Negative for malaise/fatigue.   HENT: Negative.    Eyes: Positive for photophobia.   Respiratory: Negative.    Cardiovascular: Negative.    Gastrointestinal: Positive for nausea.   Genitourinary: Negative.    Musculoskeletal: Positive for neck pain.   Skin: Negative.    Neurological: Positive for sensory change and headaches.   Endo/Heme/Allergies: Negative.    Psychiatric/Behavioral: The patient has insomnia.      All other systems reviewed and negative    Meds:  Current Facility-Administered Medications   Medication Dose Route Frequency   . dexamethasone  12 mg Intravenous BID   . diphenhydrAMINE  25 mg Oral QHS   .  diphenhydrAMINE  25 mg Intravenous BID   . famotidine  20 mg Oral Q12H SCH   . gabapentin  400 mg Oral QID   . gabapentin  800 mg Oral Q8H SCH   . heparin (porcine)  5,000 Units Subcutaneous Q12H SCH   . lidocaine   Topical TID   . methocarbamol  500 mg Oral QID   . methylPREDNISolone  4 mg Oral QD after lunch   . methylPREDNISolone  4 mg Oral QD after dinner   . methylPREDNISolone  4 mg Oral QAM AC   . methylPREDNISolone  4 mg Oral QHS   . metoclopramide  10 mg Intravenous BID   . valproate sodium  2,000 mg Intravenous Once     Current Facility-Administered Medications   Medication Dose Route Frequency Last Rate   . sodium chloride   Intravenous Continuous       Current Facility-Administered Medications   Medication Dose Route   . acetaminophen  650 mg Oral   . ibuprofen  400 mg Oral   . naloxone  0.2 mg Intravenous   . ondansetron  4 mg Intravenous   . ondansetron  4 mg  Oral    Or   . ondansetron  4 mg Intravenous   . oxyCODONE-acetaminophen  1 tablet Oral   . zolpidem  5 mg Oral       EXAM:  Visit Vitals  BP 135/87   Pulse 94   Temp 97.3 F (36.3 C) (Oral)   Resp 18   Ht 1.676 m (5\' 6" )   Wt 57 kg (125 lb 11.2 oz)   LMP 06/14/2016   SpO2 98%   BMI 20.29 kg/m     Respiratory rate normal    General: Well developed and well nourished in no acute distress. There is no icterus or cyanosis or pedal edema. Peripheral pulses are intact.   Heart S1 S2 RRR  Lungs clear no wheezes  Abdomen soft, not tender  Skin: no rashes on extremities or joint swelling  Neck: not tender, normal ROM  Right occiput severe tenderness to light touch.     Psychiatric. Cooperative. Thought content and process normal. Mood in pain. Affect congruent.     Mental Status: The patient was awake, alert, appeared oriented and was appropriate. Speech was fluent and the patient followed commands well. Attention, concentration, and memory appeared intact. Fund of knowledge appeared appropriate for education level.     Cranial Nerves: Pupils were  equally round and reactive to light bilaterally. Extraocular movements were intact without nystagmus. Hearing was intact to conversational speech. Face was symmetric. Tongue appeared midline.     Motor: grossly 5/5 throughout. Bulk appeared normal for body habitus. No obvious tremor noted.    Reflex: 2+ throughout    Sensation: light touch was grossly intact.     Coordination: Finger to nose testing was intact without dysmetria.     Gait: Normal and steady.    Imaging reports were reviewed, and images were personally reviewed. It shows unremarkable mri.    Labs were reviewed: bun / creat ok.     Testing: Relevant neurodiagnostic testing reports were ordered or reviewed (see below)    ASSESSMENT / PLAN  1. TBI. Post concussive syndrome. Contributing to memory difficulties.  - will follow.    2. Headache. Combination of right occipital neuralgia leading to migraines. Worsening. Requires ongoing inpatient status.  3. Left ulnar neuropathy. New. Worsening, severe.   - Migraine cocktail ordered as bid  - increase gabapentin to 400 / 400 / 400 / 800mg   - scheduled robaxin 500mg  qid  - lidocaine cream to right occiput.  - avoid opiates    - If persists tomorrow, would continue increasing gabapentin. Additionally may benefit from occipital nerve block.l    Discussed in person with Dr Laretta Bolster    Orders Placed This Encounter   Procedures   . CT Head WO Contrast   . CT Cervical Spine WO Contrast   . Chest 2 Views   . Shoulder Right 2+ Views   . MRI Brain W WO Contrast   . XR Elbow Left AP Lateral And Obliques   . CBC with differential   . Basic Metabolic Panel   . Troponin I   . GFR   . UA with reflex to micro (pts  3 + yrs)   . Urine Tox Screen (Rapid Drug Screen)   . Comprehensive metabolic panel   . CBC   . Hemolysis index   . GFR   . TSH   . CBC and differential   . Basic Metabolic Panel   . Hemolysis index   . GFR   .  Diet regular   . Vital Signs-Repeat   . Ambulate Patient   . ED Holding Orders Expire in 24 Hours    . Pulse Oximetry   . Progressive Mobility Protocol   . Notify physician   . I/O   . Height   . Weight   . Skin assessment   . Education: Activity   . Education: Disease Process & Condition   . Education: Pain Management   . Education: Falls Risk   . Education: Smoking Cessation   . Place sequential compression device   . Maintain sequential compression device   . Orthostatic blood pressure   . Telemetry 24 Hour Protocol   . Full Code   . PT EVALUATE AND TREAT   . ECG 12 Lead   . CARDIAC STRIPS   . EKG SCAN   . EEG   . Saline lock IV   . Place (admit) for Observation Services   . Admit to Inpatient   . [facility] ED Bed Request (Observation)       Please contact me with any questions. Patients and [place] Providers can reach me via MyChart.    Selena Swaminathan H. Theodoro Grist, MD, PhD  [facility]    [place]: [phone]  [place]: [phone]

## 2016-07-20 DIAGNOSIS — F0781 Postconcussional syndrome: Secondary | ICD-10-CM

## 2016-07-20 DIAGNOSIS — G43901 Migraine, unspecified, not intractable, with status migrainosus: Secondary | ICD-10-CM

## 2016-07-20 MED ORDER — KETOROLAC TROMETHAMINE 30 MG/ML IJ SOLN
30.0000 mg | Freq: Three times a day (TID) | INTRAMUSCULAR | Status: AC
Start: 2016-07-20 — End: 2016-07-21
  Administered 2016-07-20 (×2): 30 mg via INTRAVENOUS
  Filled 2016-07-20 (×2): qty 1

## 2016-07-20 MED ORDER — SODIUM CHLORIDE 0.9 % IV SOLN
500.0000 mg | Freq: Once | INTRAVENOUS | Status: AC
Start: 2016-07-20 — End: 2016-07-20
  Administered 2016-07-20: 12:00:00 500 mg via INTRAVENOUS
  Filled 2016-07-20: qty 4

## 2016-07-20 MED ORDER — DIPHENHYDRAMINE HCL 50 MG/ML IJ SOLN
12.5000 mg | Freq: Three times a day (TID) | INTRAMUSCULAR | Status: DC
Start: 2016-07-20 — End: 2016-07-22
  Administered 2016-07-20 – 2016-07-21 (×3): 12.5 mg via INTRAVENOUS
  Filled 2016-07-20 (×5): qty 1

## 2016-07-20 MED ORDER — VALPROATE SODIUM 100 MG/ML IV SOLN
500.0000 mg | Freq: Two times a day (BID) | INTRAVENOUS | Status: DC
Start: 2016-07-20 — End: 2016-07-21
  Administered 2016-07-20 – 2016-07-21 (×3): 500 mg via INTRAVENOUS
  Filled 2016-07-20 (×5): qty 5

## 2016-07-20 MED ORDER — PROCHLORPERAZINE EDISYLATE 5 MG/ML IJ SOLN
10.0000 mg | Freq: Three times a day (TID) | INTRAMUSCULAR | Status: DC
Start: 2016-07-20 — End: 2016-07-22
  Administered 2016-07-20 – 2016-07-21 (×4): 10 mg via INTRAVENOUS
  Filled 2016-07-20 (×10): qty 2

## 2016-07-20 MED ORDER — MAGNESIUM SULFATE IN D5W 1-5 GM/100ML-% IV SOLN
1.0000 g | Freq: Two times a day (BID) | INTRAVENOUS | Status: DC
Start: 2016-07-20 — End: 2016-07-22
  Administered 2016-07-20 – 2016-07-21 (×3): 1 g via INTRAVENOUS
  Filled 2016-07-20 (×5): qty 100

## 2016-07-20 MED ORDER — AMITRIPTYLINE HCL 25 MG PO TABS
25.0000 mg | ORAL_TABLET | Freq: Every evening | ORAL | Status: DC
Start: 2016-07-20 — End: 2016-07-23
  Administered 2016-07-21 – 2016-07-22 (×2): 25 mg via ORAL
  Filled 2016-07-20 (×3): qty 1

## 2016-07-20 NOTE — PT Progress Note (Signed)
Physical Therapy Note    [facility]  Physical Therapy Attempt Note    Patient:  Wendy Buckley MRN#:  [id number]  Unit:  20 [facility] INTER[facility]ARE Room/Bed:  V4098/J1914-N       Physical Therapy treatement attempted on 07/20/2016, unable to complete secondary to MD attending to patient.      RN notified. Will follow up at a later time when able.    Reinaldo Raddle, LPTA  [place] Lic# [id number]    Physical Medicine and Rehabilitation  [facility]  [phone]    07/20/2016  10:40 AM

## 2016-07-20 NOTE — SLP Eval Note (Signed)
[facility]  Speech and Language Therapy Evaluation     Patient: Wendy Buckley    MRN#: [id number]     Time of treatment: Time Calculation  SLP Received On: 07/20/16  Start Time: 1042  Stop Time: 1120  Time Calculation (min): 38 min    Consult received for Juanell Fairly for SLP Evaluation and Treatment.    Current Hospitalization:  Referring Physician: Topor Christina  Date of Referral: 07/20/16    Medical Diagnosis: Neck pain [M54.2]  Dizziness [R42]  Elevated blood pressure reading [R03.0]  Injury of head, initial encounter [S09.90XA]  Altered mental status, unspecified altered mental status type [R41.82]  Intractable vomiting with nausea, unspecified vomiting type [R11.2]    History of Present Illness: Wendy Buckley is a 46 y.o. female admitted on 07/14/2016 with no significant medical history who presented with head pain and dizziness following work related head trauma. 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  Patient did not have loss of consciousness, but complained of pain and dizziness. She was sensitive outside hospital emergency department for evaluation where had imaging was unremarkable. Patient's continued to have dizziness, confusion/disorientation, and blurry vision in her right eye.  Patient also had elevated blood pressure and intractable nausea and vomiting.  She was transferred to another [place] for inpatient observation and monitoring.    MRI BRAIN W WO CONTRAST  INDICATION: Transient alteration of awareness.    TECHNIQUE: Axial T1-weighted, FLAIR, T2*, and  T2-weighted images of the  brain were obtained along with sagittal T1-weighted images. An axial  diffusion-weighted sequence was obtained along with an ADC map.  Following  the administration of intravenous gadolinium, T1-weighted  axial and coronal images were obtained.    FINDINGS: No  intracranial mass lesion, hemorrhage, or territorial  infarction is demonstrated. The ventricular system and subarachnoid  spaces are within normal limits. There are a few small foci of  hyperintensity identified on long TR images in the right frontal lobe  white matter and the left parietal lobe white matter. Differential  diagnosis includes minimal microvascular ischemic changes. Additional  considerations in the differential diagnosis include demyelinating  disease, vasculitis, and infection such as Lyme disease.  Diffusion-weighted images show no regions of restricted diffusion. 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 There is mild mucosal thickening in the left  maxillary sinus and the left ethmoid sinus.    IMPRESSION:    There are a few small nonspecific foci of hyperintensity in  the white matter of the right and left cerebral hemispheres. 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 Please review  above.    [name], MD   07/16/2016 10:17 AM    Patient Active Problem List   Diagnosis   . CAP (community acquired pneumonia)   . Hypokalemia   . Hyperglycemia   . Cough   . Wheezing   . Altered mental status   . Mild TBI (traumatic brain injury)        Past Medical/Surgical History:  History reviewed. No pertinent past medical history.   Past Surgical History:   Procedure Laterality Date   . CARPAL TUNNEL RELEASE           Social History:  Prior Level of Function  Prior level of function:  Independent with ADLs, Ambulates independently  Home Living Arrangements  Living Arrangements: Family members  Type of Home: House  Home Layout:  (per pt, lives in one-level house)  Home Living - Notes / Comments: Currently, pt is poor historian so unsure of accuracy of PLOF.    Subjective: Patient is agreeable to participation in the therapy session. Nursing clears patient for therapy. Patient's medical condition is appropriate for Speech  therapy intervention at this time.        Objective:  Observation of Patient/Vital Signs:  Patient is in bed with telemetry in place.    Cognitive Status and Neuro Exam:    Montreal Cognitive Assessment (MoCA): +4/30, indicating (Normal =greater than or equal to 26)    Visuospatial/Executive: +0/1 Alternating Trail Making, +0/1 Copy Cube, +1/3 Clock Drawing    Naming:+1/3     Memory: +2/5 Immediate Memory, +0/5 Delayed Recall    Attention: +0/1 Forward Digit Span, +0/1 Backward Digit Span, +0/1 Vigilance,  +0/3 Serial 7s    Language: +0/2 Sentence Repetition, +0/1 Verbal Fluency    Abstraction: +0/2    Orientation: +1/6      Patient presented with slow processing and reduced attention which impacted her score and ability to complete testing accurately.  Patient required multiple repetition and segmentation of directions in order to complete tasks.  For example, when completing alternating numbers with letters, the patient required directions repeated several times with assistance to connect each set of numbers and letters together.  Despite moderate verbal cues, patient completed task incorrectly.  Patient with impaired naming calling a rhinoceros a bull and a camel a horse.  She was not able to recall 5 words to test immediate memory despite 5 repetitions and visual presentation.  Delayed recall after 5 minutes was 0.  She had difficulty repeating words in sentences due to reduced attention.  Her ability to generate words that begin with a letter resulted in 2 words given.  Patient stated the month was 7 and when probed what month is that, she was observed to count on her fingers for each month until she reached the month of July and then was able to state July.  Orientation to location and time was poor.  Patient warrants continued speech therapy services after [place] from facility.  SLP spoke patient's brother who stated this is not her baseline.  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          Tracheostomy  Resp Rate: 17  SpO2: 99 %                Treatment Activities: speech therapy to address cognitive-linguistic deficits in Visuospatial reasoning, naming, attention, memory, language and orientation  Educated the patient to role of speech therapy, plan of care, goals of therapy.    Clinical Impression: Jaclyn Andy is a 46 y.o. female admitted 07/14/2016 for Neck pain [M54.2]  Dizziness [R42]  Elevated blood pressure reading [R03.0]  Injury of head, initial encounter [S09.90XA]  Altered mental status, unspecified altered mental status type [R41.82]  Intractable vomiting with nausea, unspecified vomiting type [R11.2] presenting with impairments in cognitive-linguistic function  .     Therapy Diagnosis: Cognitive-Linguistic deficits in executive function, memory, and attention     Expected disposition:    Out patient therapy recommended     Plan:          Goals: Goals to be achieved  in out patient therapy        1.  Patient will complete tasks to increase sustained attention to 1-3 minutes with moderate verbal cues to complete tasks with 70% accuracy.  2.  Patient will complete immediate and delayed recall for 5-7 items with moderate verbal cues with 70% accuracy.  3.  Patient will complete divergent thinking tasks in order to increase verbal fluency with moderate verbal cues at 70% accuracy.  4.  Patient will complete temporal, spatial and environmental orientation tasks with moderate verbal cues at 70% accuracy.                          Karleen Hampshire M.S., CCC-SLP

## 2016-07-20 NOTE — Progress Notes (Signed)
[facility]    [facility] Consultation Progress Note    Date Time: 07/20/16 11:59 AM  Patient Name: Wendy Buckley  Outpatient Neurologist :    CC: Head injury, confusion           Assessment:   S/p head injury resulting in HA, dizziness, confusion/disorientation in a 46 y/o female with no significant PMH. MRI brain without signfiicant findings. rEEG without seizures. Confusion/mental status improving though HA, dizziness, and vomiting persist.  Suspect a post concussive syndrome (symptoms can last months after a mild TBI)       Patient Active Problem List   Diagnosis   . CAP (community acquired pneumonia)   . Hypokalemia   . Hyperglycemia   . Cough   . Wheezing   . Altered mental status   . Mild TBI (traumatic brain injury)       Plan:   -For headache Toradol 30 mg q8h, Compazine 10 mg TID, Benadryl 12.5 mg TID, Magnesium 1 gm BID, Depakote 500 mg q12h (ordered). Also added Solumedrol 500 mg IV daily  -Will also add Elavil 25 mg qhs to promote sleep and also help with headache prevention   -Continue Robaxin for now   -Continue Neurontin 800 mg TID  -PT/OT as needed  -Medical management per primary team  -Ok to d/c once headache is 5/10 (patient's goal). Upon discharge, pt will need to f/u with [facility] in 2 weeks     Neurology Attending Addendum:    I have personally reviewed the interval history, images and pertinent test results and I have personally examined the patient and confirmed the major physical findings of the preceding NP/PA note.  Also, I have noted any changes since the note was written as well as added additional findings and recommendations.    HA management as above.  Agreed for goal intensity of headache 5/10.  Discussed expectations of post concussive syndrome.    Charlynn Court, MD  [facility]      Interval History/Subjective:   Pt currently endorses a bifrontal headache, currently 7-8/10, no true alleviating factors. Also with R sided neck pain which is worse  with head movement/position change, currently 7/10, no true alleviating factors   Currently denies N/V, speech changes, dizziness.         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 These findings likely incidental and unrelated to clinical picture  rEEG No epileptiform discharges, focal slowing, spike waves or clinical events noted.      Medications:     Current Facility-Administered Medications   Medication Dose Route Frequency   . amitriptyline  25 mg Oral QHS   . diphenhydrAMINE  12.5 mg Intravenous Q8H   . docusate sodium  100 mg Oral Q12H   . famotidine  20 mg Oral Q12H SCH   . gabapentin  800 mg Oral Q8H SCH   . heparin (porcine)  5,000 Units Subcutaneous Q12H SCH   . ketorolac  30 mg Intravenous Q8H   . lidocaine   Topical TID   . magnesium sulfate  1 g Intravenous Q12H   . methocarbamol  500 mg Oral QID   . methylPREDNISolone  500 mg Intravenous Once   . prochlorperazine  10 mg Intravenous Q8H   . valproate sodium  500 mg Intravenous Q12H       Review of Systems:   Negative except for what is mentioned in the interval/subjective section  Physical Exam:   Temp:  [97.4 F (36.3 C)-97.8 F (36.6 C)] 97.4 F (36.3 C)  Heart Rate:  [76-84] 78  Resp Rate:  [17-18] 17  BP: (117-139)/(72-84) 117/72    Vital Signs:  Reviewed    General: The patient was well developed and well nourished.  No acute distress. Cooperative with the exam  Extremities: no pedal edema, extremities normal in color    Mental Status: The patient was awake, alert and oriented to person and "hospital".  Affect is blunted   Fund of knowledge intatct  Attention span and concentration appear normal.  Memory impaired   Speech clear, fluent.  Follows commands     Cranial nerves:   -CN II: Visual fields full to bedside confrontation   -CN III, IV, VI: Pupils equal, round, and reactive to light; extraocular movements intact; no ptosis             -CN VIII: Hearing intact to  conversational speech   -CN IX, X: Normal phonation       Motor: Muscle tone normal. Strength essentially 5/5     Sensory:   Light touch intact.    Coordination: No tremors    Gait:Deferred       Labs:     Results     ** No results found for the last 24 hours. **          Rads:   Chest 2 Views    Result Date: 07/14/2016       NO SIGNIFICANT ABNORMALITY. [name], MD 07/14/2016 8:43 PM    Xr Elbow Left Ap Lateral And Obliques    Result Date: 07/17/2016   1. Small joint effusion otherwise negative [name], MD 07/17/2016 5:06 PM    Ct Head Wo Contrast    Result Date: 07/14/2016      NO ACUTE INTRACRANIAL ABNORMALITY. [name], MD 07/14/2016 8:47 PM    Ct Cervical Spine Wo Contrast    Result Date: 07/14/2016    SPASM. NO ACUTE FRACTURE. [name], MD 07/14/2016 8:49 PM    Mri Brain W Wo Contrast    Result Date: 07/16/2016   There are a few small nonspecific foci of hyperintensity in the white matter of the right and left cerebral hemispheres. 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 Please review above. [name], MD 07/16/2016 10:17 AM    Shoulder Right 2+ Views    Result Date: 07/14/2016   NO SIGNIFICANT ABNORMALITY. [name], MD 07/14/2016 8:33 PM        Signed by:  Santo Held, FNP-BC  Nurse Practitioner  [facility]  Spectra: [facility] [phone]  [facility] [phone]    *Final recommendations per attending neurologist who will also see patient today and record notes.

## 2016-07-20 NOTE — Progress Notes (Signed)
07/20/2016  SLP recommending out pt SLP.  PT recommending Acute rehab.  Referral pending with [facility] acute rehab.  DCP: acute rehab vs SNF vs home  Worker's comp pt  Van Clines RN, [place]  Case Management  [phone]

## 2016-07-20 NOTE — Progress Notes (Signed)
[facility]  PROGRESS NOTE      Patient: Wendy Buckley  Date: 07/20/2016   LOS: 3 Days  Admission Date: 07/14/2016   MRN: [id number]  Attending: Silvestre Buckley  Wendy Buckley  Please page me at [phone]       ASSESSMENT/PLAN     Wendy Buckley is Wendy 46 y.o. 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 Significant dizziness with confusion and memory loss -  patient still significant unsteadiness on her feet , persistent  headache, dizziness, nausea, vomiting, photosensitivity   MRI brain without acute findings  -Speech therapy/OT evaluation pending  -Toradol 30 mg IV every 8 hours  -Compazine 10 mg 3 times Wendy day  -When necessary Benadryl, magnesium, Depakote  -Added Solu-Medrol  -Robaxin  -Neurontin 800 mg 3 times Wendy day    HTN-stable  - continue to monitor        Nutrition: Reg    DVT Prophylaxis: SCDs, Heparin sq    Code Status: Full Code           SUBJECTIVE     C/o headache, not better at all  Vomited x 2  Could not sleep last night    MEDICATIONS     Current Facility-Administered Medications   Medication Dose Route Frequency   . amitriptyline  25 mg Oral QHS   . diphenhydrAMINE  12.5 mg Intravenous Q8H   . docusate sodium  100 mg Oral Q12H   . famotidine  20 mg Oral Q12H SCH   . gabapentin  800 mg Oral Q8H SCH   . heparin (porcine)  5,000 Units Subcutaneous Q12H SCH   . ketorolac  30 mg Intravenous Q8H   . lidocaine   Topical TID   . magnesium sulfate  1 g Intravenous Q12H   . methocarbamol  500 mg Oral QID   . prochlorperazine  10 mg Intravenous Q8H   . valproate sodium  500 mg Intravenous Q12H       PHYSICAL EXAM     Vitals:    07/20/16 0652   BP: 117/72   Pulse: 78   Resp: 17   Temp: 97.4 F (36.3 C)   SpO2: 99%       Temperature: Temp  Min: 97.4 F (36.3 C)  Max: 97.7 F (36.5 C)  Pulse: Pulse  Min: 76  Max: 82  Respiratory: Resp  Min: 17  Max:  18  Non-Invasive BP: BP  Min: 117/72  Max: 139/80  Pulse Oximetry SpO2  Min: 99 %  Max: 99 %    Intake and Output Summary (Last 24 hours) at Date Time    Intake/Output Summary (Last 24 hours) at 07/20/16 1647  Last data filed at 07/20/16 1100   Gross per 24 hour   Intake              150 ml   Output                1 ml   Net              149 ml       GEN APPEARANCE: Wendy and Ox3, uses eyescreen  HEENT: No scleral icterus, MMM  CVS: RRR, S1, S2; No M/G/R  LUNGS: CTAB; No Wheezes; No Rhonchi: No rales  ABD: Soft; No TTP; + Normoactive BS, no rebound or guarding  EXT: WWP,   Skin exam:  No gross lesions noted on exposed skin surfaces  MENTAL STATUS:  Answers questions appropriately, responds to commands - very slow to answer, at times forgetful. Needs to be frequently re-oriented. Cranial nerve exam:  EOMI, CN V sensory/motor intact, no facial asymmetry noted, patient able to hear, uvula midline. No gross motor or sensory deficits          LABS       Recent Labs  Lab 07/17/16  1334 07/15/16  0454 07/14/16  2046   WBC 11.50* 5.18 8.08   RBC 4.31 4.02* 4.33   Hgb 11.3* 10.3* 11.1*   Hematocrit 36.5* 33.8* 36.0*   MCV 84.7 84.1 83.1   Platelets 340 286 344         Recent Labs  Lab 07/17/16  1334 07/15/16  0455 07/14/16  2046   Sodium 137 138 140   Potassium 4.4 4.1 3.7   Chloride 108 111 107   CO2 21* 20* 22   BUN 15.0 14.0 18.0   Creatinine 0.9 0.7 1.1*   Glucose 104* 86 89   Calcium 9.3 8.9 9.7         Recent Labs  Lab 07/15/16  0455   ALT 7   AST (SGOT) 14   Bilirubin, Total 0.4   Albumin 3.2*   Alkaline Phosphatase 38         Recent Labs  Lab 07/14/16  2046   Troponin I 0.01             Microbiology Results     None           RADIOLOGY     Chest 2 Views    Result Date: 07/14/2016       NO SIGNIFICANT ABNORMALITY. [name], MD 07/14/2016 8:43 PM    Xr Elbow Left Ap Lateral And Obliques    Result Date: 07/17/2016   1. Small joint effusion otherwise negative [name], MD 07/17/2016 5:06 PM    Ct Head Wo  Contrast    Result Date: 07/14/2016      NO ACUTE INTRACRANIAL ABNORMALITY. [name], MD 07/14/2016 8:47 PM    Ct Cervical Spine Wo Contrast    Result Date: 07/14/2016    SPASM. NO ACUTE FRACTURE. [name], MD 07/14/2016 8:49 PM    Mri Brain W Wo Contrast    Result Date: 07/16/2016   There are Wendy few small nonspecific foci of hyperintensity in the white matter of the right and left cerebral hemispheres. 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 Please review above. [name], MD 07/16/2016 10:17 AM    Shoulder Right 2+ Views    Result Date: 07/14/2016   NO SIGNIFICANT ABNORMALITY. [name], MD 07/14/2016 8:33 PM        Signed,  Sheria Lang Yassir Enis  4:47 PM 07/20/2016

## 2016-07-20 NOTE — Plan of Care (Addendum)
Problem: Neurological Deficit  Goal: Neurological status is stable or improving  Outcome: Progressing   07/20/16 1249   Goal/Interventions addressed this shift   Neurological status is stable or improving Observe for seizure activity and initiate seizure precautions if indicated;Perform CAM Assessment     Drowsy most of time. Easily aroused by verbal. Reports having persistent headache. Magnesium iv 1 gm x 1, IV valproic acid,  IV methylprednisolone given in am today. On Toradol q6h along with compazine for headache & nausea.  vomiting x 1 during the shift so far. Has poor appetite d/t nausea. Seen by ST for SLP. Still having difficulty finding words per ST. NSR on the tele. Vss. No sob or respiratory distress. Will continue to monitor.

## 2016-07-20 NOTE — Plan of Care (Signed)
Problem: Speech Language Pathology  Goal: By discharge, patient will demonstrate cognition, communication and safe swallowing at the patient's highest functional potential.  See SLP evaluation(s)/note for goals.  Outcome: Progressing  Goals: Goals to be achieved in out patient therapy        1.  Patient will complete tasks to increase sustained attention to 1-3 minutes with moderate verbal cues to complete tasks with 70% accuracy.  2.  Patient will complete immediate and delayed recall for 5-7 items with moderate verbal cues with 70% accuracy.  3.  Patient will complete divergent thinking tasks in order to increase verbal fluency with moderate verbal cues at 70% accuracy.  4.  Patient will complete temporal, spatial and environmental orientation tasks with moderate verbal cues at 70% accuracy.        Karleen Hampshire M.S., CCC-SLP

## 2016-07-20 NOTE — Plan of Care (Signed)
Problem: Safety  Goal: Patient will be free from injury during hospitalization  Outcome: Progressing   07/20/16 0326   Goal/Interventions addressed this shift   Patient will be free from injury during hospitalization  Assess patient's risk for falls and implement fall prevention plan of care per policy;Provide and maintain safe environment;Use appropriate transfer methods;Ensure appropriate safety devices are available at the bedside;Include patient/ family/ care giver in decisions related to safety;Hourly rounding       Problem: Pain  Goal: Pain at adequate level as identified by patient  Outcome: Progressing   07/20/16 0326   Goal/Interventions addressed this shift   Pain at adequate level as identified by patient Identify patient comfort function goal;Assess for risk of opioid induced respiratory depression, including snoring/sleep apnea. Alert healthcare team of risk factors identified.;Assess pain on admission, during daily assessment and/or before any "as needed" intervention(s);Reassess pain within 30-60 minutes of any procedure/intervention, per Pain Assessment, Intervention, Reassessment (AIR) Cycle;Evaluate patient's satisfaction with pain management progress;Evaluate if patient comfort function goal is met;Offer non-pharmacological pain management interventions       Problem: Neurological Deficit  Goal: Neurological status is stable or improving  Outcome: Progressing   07/20/16 0326   Goal/Interventions addressed this shift   Neurological status is stable or improving Monitor/assess/document neurological assessment (Stroke: every 4 hours)       Problem: Compromised Hemodynamic Status  Goal: Vital signs and fluid balance maintained/improved  Outcome: Progressing   07/20/16 0326   Goal/Interventions addressed this shift   Vital signs and fluid balance are maintained/improved Position patient for maximum circulation/cardiac output;Monitor/assess lab values and report abnormal values       Problem:  Anxiety  Goal: Anxiety is at a manageable level  Outcome: Progressing   07/20/16 0326   Goal/Interventions addressed this shift   Anxiety is at a manageable level Orient to unit;Inform/explain to patient/patient care companion all tests/procedures/treatment/care prior to initiation;Facilitate expression of feelings, fears, concerns, anxiety;Assess emotional status and coping mechanisms;Provide alternatives to reduce anxiety;Provide and maintain a safe environment;Limit or eliminate stimulants such as caffeine and nicotine       Problem: Altered GI Function  Goal: Elimination patterns are normal or improving  Outcome: Progressing   07/20/16 0326   Goal/Interventions addressed this shift   Elimination patterns are normal or improving Monitor for abdominal discomfort     Patient is axox3, forgetful. At times, pt keesp repeating same question over and over: Reoriented to situation as needed.   Pt with mild c/o headache/ discomfort and right elbow: given PO Neurontin and IV Decadron.   Denies any nausea or vomiting so far, however pt has low appetite. Encouraged with increase of PO intake.   Tele: NSR.   Pt requested for sleeping aid: was given 5 mg of Ambien. Now sleeping with no acute distress. Will continue to monitor.

## 2016-07-21 LAB — BASIC METABOLIC PANEL
Anion Gap: 7 (ref 5.0–15.0)
BUN: 25 mg/dL — ABNORMAL HIGH (ref 7.0–19.0)
CO2: 25 mEq/L (ref 22–29)
Calcium: 9.3 mg/dL (ref 8.5–10.5)
Chloride: 104 mEq/L (ref 100–111)
Creatinine: 0.7 mg/dL (ref 0.6–1.0)
Glucose: 97 mg/dL (ref 70–100)
Potassium: 4.5 mEq/L (ref 3.5–5.1)
Sodium: 136 mEq/L (ref 136–145)

## 2016-07-21 LAB — GFR: EGFR: >60

## 2016-07-21 LAB — CBC
Absolute NRBC: 0 10*3/uL
Hematocrit: 32 % — ABNORMAL LOW (ref 37.0–47.0)
Hgb: 10.1 g/dL — ABNORMAL LOW (ref 12.0–16.0)
MCH: 26.2 pg — ABNORMAL LOW (ref 28.0–32.0)
MCHC: 31.6 g/dL — ABNORMAL LOW (ref 32.0–36.0)
MCV: 82.9 fL (ref 80.0–100.0)
MPV: 9.4 fL (ref 9.4–12.3)
Nucleated RBC: 0 /100 WBC (ref 0.0–1.0)
Platelets: 313 10*3/uL (ref 140–400)
RBC: 3.86 10*6/uL — ABNORMAL LOW (ref 4.20–5.40)
RDW: 16 % — ABNORMAL HIGH (ref 12–15)
WBC: 21.38 10*3/uL — ABNORMAL HIGH (ref 3.50–10.80)

## 2016-07-21 LAB — HEMOLYSIS INDEX: Hemolysis Index: 11 (ref 0–18)

## 2016-07-21 MED ORDER — ZOLPIDEM TARTRATE 5 MG PO TABS
5.0000 mg | ORAL_TABLET | Freq: Every evening | ORAL | Status: DC | PRN
Start: 2016-07-21 — End: 2016-07-28

## 2016-07-21 MED ORDER — DIVALPROEX SODIUM 125 MG PO CSDR
500.0000 mg | DELAYED_RELEASE_CAPSULE | Freq: Two times a day (BID) | ORAL | Status: DC
Start: 2016-07-21 — End: 2016-07-26
  Administered 2016-07-21 – 2016-07-26 (×10): 500 mg via ORAL
  Filled 2016-07-21 (×11): qty 4

## 2016-07-21 MED ORDER — ACETAMINOPHEN 325 MG PO TABS
650.0000 mg | ORAL_TABLET | Freq: Four times a day (QID) | ORAL | Status: AC | PRN
Start: 2016-07-21 — End: ?

## 2016-07-21 MED ORDER — ONDANSETRON 4 MG PO TBDP
4.0000 mg | ORAL_TABLET | Freq: Four times a day (QID) | ORAL | Status: AC | PRN
Start: 2016-07-21 — End: ?

## 2016-07-21 MED ORDER — AMITRIPTYLINE HCL 25 MG PO TABS
25.0000 mg | ORAL_TABLET | Freq: Every evening | ORAL | Status: AC
Start: 2016-07-21 — End: ?

## 2016-07-21 MED ORDER — POLYETHYLENE GLYCOL 3350 17 G PO PACK
17.0000 g | PACK | Freq: Every day | ORAL | Status: AC | PRN
Start: 2016-07-21 — End: ?

## 2016-07-21 MED ORDER — METHOCARBAMOL 500 MG PO TABS
500.0000 mg | ORAL_TABLET | Freq: Three times a day (TID) | ORAL | Status: DC | PRN
Start: 2016-07-21 — End: 2016-07-28

## 2016-07-21 MED ORDER — GABAPENTIN 400 MG PO CAPS
800.0000 mg | ORAL_CAPSULE | Freq: Three times a day (TID) | ORAL | Status: DC
Start: 2016-07-21 — End: 2016-07-28

## 2016-07-21 NOTE — Progress Notes (Signed)
Acute Rehab referral received.  Patient needs a PT and OT note to determine her physical needs at this time.  Patient does have SLP/cognitive deficits.  Will follow.  [place], [place] [id number]

## 2016-07-21 NOTE — Discharge Summary (Signed)
[facility]      Patient: Wendy Buckley  Admission Date: 07/14/2016   DOB: 11/08/1970  Discharge Date:    MRN: [id number]  Discharge Attending:Reza Crymes  A Ivan Maskell     Referring Physician: [name], MD  PCP: [name], MD       DISCHARGE SUMMARY     Discharge Information   Admission Diagnosis:   Mild TBI (traumatic brain injury)    Discharge Diagnosis:   Active Hospital Problems    Diagnosis   . 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 Acute metabolic encephalopathy secondary to concussion          Admission Condition: Guarded  Discharge Condition: Stable  Consultants: Neurology  Functional Status: Ambulatory  Discharged to: Home    Discharge Medications:     Medication List      START taking these medications    acetaminophen 325 MG tablet  Commonly known as:  TYLENOL  Take 2 tablets (650 mg total) by mouth every 6 (six) hours as needed for Pain.     amitriptyline 25 MG tablet  Commonly known as:  ELAVIL  Take 1 tablet (25 mg total) by mouth nightly.     gabapentin 400 MG capsule  Commonly known as:  NEURONTIN  Take 2 capsules (800 mg total) by mouth every 8 (eight) hours.     methocarbamol 500 MG tablet  Commonly known as:  ROBAXIN  Take 1 tablet (500 mg total) by mouth 3 (three) times daily as needed (headache).     ondansetron 4 MG disintegrating tablet  Commonly known as:  ZOFRAN-ODT  Take 1 tablet (4 mg total) by mouth every 6 (six) hours as needed for Nausea.     polyethylene glycol packet  Commonly known as:  MIRALAX  Take 17 g by mouth daily as needed (constipation).     zolpidem 5 MG tablet  Commonly known as:  AMBIEN  Take 1 tablet (5 mg total) by mouth nightly as needed for Sleep.           Where to Get Your Medications      Information about where to get these medications is not yet available    Ask your nurse or doctor about these medications   acetaminophen 325 MG tablet   amitriptyline 25 MG tablet   gabapentin 400 MG capsule   methocarbamol 500 MG  tablet   ondansetron 4 MG disintegrating tablet   polyethylene glycol packet   zolpidem 5 MG tablet             Hospital Course   Presentation History / Hospital Course (4 Days):S/p head injury resulting in HA, dizziness, confusion/disorientation in a 46 y/o female with no significant PMH. MRI brain without signfiicant findings. rEEG without seizures. Confusion/mental status improving though HA, dizziness, and vomiting persist.  Suspect a post concussive syndrome (symptoms can last months after a mild TBI) . Patient was treated for headache with Toradol 30 mg q8h, Compazine 10 mg TID, Benadryl 12.5 mg TID, Magnesium 1 gm BID, Depakote 500 mg q12h (ordered), Solumedrol 500 mg IV daily, Elavil 25 mg qhs  Robaxin QID,Neurontin 800 mg TID but made minimal progress.She was seen by PT/OT/speech therapy and it was suggested that she would benefit from acute rehab since her symptoms could last for several weeks.         RECOMMENDATIONS:    - Patient was informed of abnormal and incidental imaging findings during hospitalization, and advised to review  this information with their  medical provider.    Procedures/Imaging:   Chest 2 Views    Result Date: 07/14/2016       NO SIGNIFICANT ABNORMALITY. [name], MD 07/14/2016 8:43 PM    Xr Elbow Left Ap Lateral And Obliques    Result Date: 07/17/2016   1. Small joint effusion otherwise negative [name], MD 07/17/2016 5:06 PM    Ct Head Wo Contrast    Result Date: 07/14/2016      NO ACUTE INTRACRANIAL ABNORMALITY. [name], MD 07/14/2016 8:47 PM    Ct Cervical Spine Wo Contrast    Result Date: 07/14/2016    SPASM. NO ACUTE FRACTURE. [name], MD 07/14/2016 8:49 PM    Mri Brain W Wo Contrast    Result Date: 07/16/2016   There are a few small nonspecific foci of hyperintensity in the white matter of the right and left cerebral hemispheres. 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 Please review above. [name], MD 07/16/2016 10:17  AM    Shoulder Right 2+ Views    Result Date: 07/14/2016   NO SIGNIFICANT ABNORMALITY. [name], MD 07/14/2016 8:33 PM     Echo Results     None               Best Practices   Was the patient admitted with either a CHF Exacerbation or Pneumonia? NO     Progress Note/Physical Exam at Discharge     Subjective: Patient reported feeling well, and is ready for discharge.    Vitals:    07/20/16 0652 07/20/16 1900 07/21/16 0400 07/21/16 0800   BP: 117/72 134/71 101/62 119/69   Pulse: 78 80 78 76   Resp: 17 18 18 18    Temp: 97.4 F (36.3 C) 97.4 F (36.3 C) 97.4 F (36.3 C) (!) 96.8 F (36 C)   TempSrc:       SpO2: 99% 99% 98% 99%   Weight:   56.5 kg (124 lb 9.6 oz)    Height:           General: NAD  HEENT: sclera anicteric, OP: Clear, MMM  Cardiovascular: RRR, no m/r/g  Lungs: CTAB, no w/r/r  Abdomen: soft, +BS, NT/ND, no masses, no g/r  Extremities: Warm and well perfused  Skin: no rashes or lesions noted on exposed surfaces  Neuro: Answers questions appropriately, responds to commands       Diagnostics     Labs/Studies Pending at Discharge: NO    Last Labs     Recent Labs  Lab 07/17/16  1334 07/15/16  0454 07/14/16  2046   WBC 11.50* 5.18 8.08   RBC 4.31 4.02* 4.33   Hgb 11.3* 10.3* 11.1*   Hematocrit 36.5* 33.8* 36.0*   MCV 84.7 84.1 83.1   Platelets 340 286 344         Recent Labs  Lab 07/17/16  1334 07/15/16  0455 07/14/16  2046   Sodium 137 138 140   Potassium 4.4 4.1 3.7   Chloride 108 111 107   CO2 21* 20* 22   BUN 15.0 14.0 18.0   Creatinine 0.9 0.7 1.1*   Glucose 104* 86 89   Calcium 9.3 8.9 9.7       Microbiology Results     None           Patient Instructions   Discharge Diet:   Discharge Activity: As tolerated  LABS/TESTING  after discharge    Follow Up Appointment:  Follow-up Information     [facility]. Schedule an appointment as soon as possible for a visit in 1 week(s).    Contact information:  [address]  [address]  [place]  [address]  [phone]           Vivi Martens, MD PhD. Schedule an appointment as soon as possible for a visit in 1 week(s).    Specialty:  Neurology  Contact information:  [address]  [place] [address]  [phone]                    Time spent examining patient, discussing with patient/family regarding hospital course, chart review, reconciling medications and discharge planning: > 35 minutes.  This patient was examined by me on , the day of discharge.    SignedSheria Lang Jameah Rouser    9:28 AM 07/21/2016

## 2016-07-21 NOTE — PT Progress Note (Signed)
[facility]  Physical Therapy Treatment    Patient:  Nolah Krenzer  MRN#:  [id number]  Unit:  [facility]  Room/Bed:  V4098/J1914-N    Time of treatment:  Time Calculation  PT Received On: 07/21/16  Start Time: 1600  Stop Time: 1615  Time Calculation (min): 15 min            Chart Review and Collaboration with Care Team: 3 minutes, not included in above time.    PT Visit Number: 3    Precautions:   Precautions  Weight Bearing Status: no restrictions  Other Precautions: fall risk    Updated X-Rays/Tests/Labs:  Lab Results   Component Value Date/Time    HGB 10.1 (L) 07/21/2016 09:49 AM    HCT 32.0 (L) 07/21/2016 09:49 AM    K 4.5 07/21/2016 09:49 AM    NA 136 07/21/2016 09:49 AM    TROPI 0.01 07/14/2016 08:46 PM       All imaging reviewed, please see chart for details.      Subjective:  "i want to eat", pt required encouragement to participate prior to meal delivery    Patient Goal: unstated                Patient's medical condition is appropriate for Physical Therapy intervention at this time.  Patient is agreeable to participation in the therapy session. Nursing clears patient for therapy.      Objective:  Observation of Patient/Vital Signs:  stable    Patient received in bed with intravenous (IV) line and bed alarm  in place.    Cognition/Neuro Status  Arousal/Alertness: Appropriate responses to stimuli  Attention Span: Attends to task with redirection  Following Commands: Follows one step commands without difficulty  Safety Awareness: moderate verbal instruction  Behavior: calm;cooperative                      Functional Mobility  Supine to Sit: Independent  Scooting to EOB: Independent  Sit to Supine: Independent  Sit to Stand: Stand by Assist;Contact Guard Assist  Stand to Sit: Stand by Assist  Transfers  Bed to Chair: Supervision;Stand by Assist  Chair to Bed: Supervision;Stand by Assist  Device Used for Functional Transfer: front-wheeled walker  Locomotion  Ambulation: Stand  by Assist;Contact Guard Assist;with front-wheeled walker (45')  Pattern: R flexed knee;L flexed knee;decreased step length;decreased cadence (occasional midline crossing)  Distance Walked (ft) (Step 6,7): 45 Feet  Pt instructed on keeping AD on floor and  keeping AD within safe distance of self for adequate support.   Instructed on knee extension during gait for increased balance  Note midline crossing during 90/180 degree turning, instruction on incremental turns as needed        Neuro Re-Ed  Standing Balance: with instruction;with support;patient education  Pt completed static standing with instruction on B knee ext and widening BOS for stability         Educated the patient to role of physical therapy, plan of care, goals of therapy and safety with mobility and ADLs, use of AD.    Patient left in bed with bed alarm in place and call bell and all personal items/needs within reach. RN notified of session outcome.       Assessment:  Pt making progress toward goals, ambulated increased distance in room today. Gait pattern, safety awareness and activity tolerance remain impaired. Pt required instruction on proper use of AD and safety surrounding obstacle negotiation. Limited  by dizziness and photophobia Pt will benefit from continued tx during admission.     PMP Activity: Step 6 - Walks in Room  Distance Walked (ft) (Step 6,7): 45 Feet      Plan:  Treatment/Interventions: Exercise, Gait training, LE strengthening/ROM, Cognitive reorientation, Patient/family training, Neuromuscular re-education, Equipment eval/education, Continued evaluation      PT Frequency: 3-4x/wk   Continue plan of care.    Goals:  Goals  Goal Formulation: With patient  Time for Goal Acheivement: By time of discharge  Goals: Select goal  Pt Will Perform Sit to Stand: independent, to maximize functional mobility and independence, Partly met  Pt Will Transfer Bed/Chair: independent, to maximize functional mobility and independence, Partly met  Pt  Will Ambulate: 151-200 feet, independent, to maximize functional mobility and independence, Partly met        DME Recommended for Discharge: Front wheel walker  Discharge Recommendation: Acute Rehab, Other (Comment) (otherwise home with 24/7 supervision and HHPT/OT)    Dorann Ou, PT  [phone]  07/21/2016  4:38 PM

## 2016-07-21 NOTE — Plan of Care (Signed)
Problem: Occupational Therapy  Goal: By discharge, patient will perform self-care at the patient's highest functional potential.  See OT evaluation/note for goals.  Outcome: Progressing  Discharge Recommendation: Acute Rehab   DME Recommended for Discharge: Other (Comment) (defer to next level of care)    OT Frequency Recommended: 4-5x/wk     Is PT evaluation indicated at this time? This patient is already on PT caseload.     PMP Activity: Step 6 - Walks in Room  Distance Walked (ft) (Step 6,7): 35 Feet  (Please See Therapy Evaluation for device and assistance level needed)    Treatment Interventions: ADL retraining, Functional transfer training, VP retraining, Endurance training, UE strengthening/ROM, Cognitive reorientation, Patient/Family training, Equipment eval/education, Neuro muscular reeducation, Fine motor coordination activities, Compensatory technique education, Continued evaluation       Goal Formulation: Patient  Time For Goal Achievement: by time of discharge  ADL Goals  Patient will groom self: Supervision  Patient will dress lower body: Stand by Assist  Patient will toilet: Stand by Assist  Mobility and Transfer Goals  Pt will transfer bed to toilet: Stand by Assist        Executive Fucntion Goals  Pt will demonstrate safety: with supervision, to increase ability to complete ADLs  Pt will demonstrate attention to task: with supervision, to increase ability to complete ADLs                Wendy Buckley, OTR/L [id number]  07/21/2016 2:39 PM

## 2016-07-21 NOTE — Progress Notes (Signed)
[facility]    [facility] Consultation Progress Note    Date Time: 07/21/16 2:01 PM  Patient Name: [facility]  Outpatient Neurologist :    CC: Head injury, confusion           Assessment:   S/p head injury resulting in HA, dizziness, confusion/disorientation in a 46 y/o female with no significant PMH. MRI brain without signfiicant findings. rEEG without seizures. Confusion/mental status improving though HA, dizziness, and vomiting persist.  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 S/p Toradol, Compazine, Reglan, Solumedrol, benadryl, Depakote, Robaxin, and Neurontin with no relief in symptoms       Patient Active Problem List   Diagnosis   . CAP (community acquired pneumonia)   . Hypokalemia   . Hyperglycemia   . Cough   . Wheezing   . Altered mental status   . Mild TBI (traumatic brain injury)       Plan:   -D/c  Toradol, Compazine, Benadryl, Magnesium, Depakote, Solumedrol as these do not seem to be helping with headache   -Continue Elavil 25 mg qhs to promote sleep and also help with headache prevention. Can increase to 50 mg qhs in 1 week if tolerates  -Continue Neurontin 800 mg TID  -OK to continue Robaxin and Phenergan PRN  -PT/OT as needed  -Medical management per primary team  -Ok to d/c to rehab, pt will need to f/u with [facility] in 2 weeks       Interval History/Subjective:   Pt states HA is currently 7/10 and neck pain is 8/10 (worse with movement), no true alleviating factors.       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 These findings likely incidental and unrelated to clinical picture  rEEG No epileptiform discharges, focal slowing, spike waves or clinical events noted.      Medications:     Current Facility-Administered Medications   Medication Dose Route Frequency   . amitriptyline  25 mg Oral QHS   . diphenhydrAMINE  12.5 mg Intravenous Q8H   . docusate sodium  100 mg Oral Q12H    . famotidine  20 mg Oral Q12H SCH   . gabapentin  800 mg Oral Q8H SCH   . heparin (porcine)  5,000 Units Subcutaneous Q12H SCH   . lidocaine   Topical TID   . magnesium sulfate  1 g Intravenous Q12H   . methocarbamol  500 mg Oral QID   . prochlorperazine  10 mg Intravenous Q8H   . valproate sodium  500 mg Intravenous Q12H       Review of Systems:   Negative except for what is mentioned in the interval/subjective section       Physical Exam:   Temp:  [96.8 F (36 C)-97.4 F (36.3 C)] 96.8 F (36 C)  Heart Rate:  [76-80] 76  Resp Rate:  [18] 18  BP: (101-134)/(62-71) 119/69    Vital Signs:  Reviewed    General: The patient was well developed and well nourished.  No acute distress. Cooperative with the exam  Extremities: no pedal edema, extremities normal in color    Mental Status: The patient was awake, alert and oriented to person, place, time, president.  Affect is blunted   Speech clear, fluent.  Follows commands     Cranial nerves:   -CN III, IV, VI: Pupils equal, round, and reactive to light; extraocular movements intact; no  ptosis             -CN VIII: Hearing intact to conversational speech   -CN IX, X: Normal phonation       Motor: Strength essentially 5/5       Gait:Deferred       Labs:     Results     Procedure Component Value Units Date/Time    Basic Metabolic Panel [[id number]]  (Abnormal) Collected:  07/21/16 0949    Specimen:  Blood Updated:  07/21/16 1040     Glucose 97 mg/dL      BUN 11.9 (H) mg/dL      Creatinine 0.7 mg/dL      Calcium 9.3 mg/dL      Sodium 147 mEq/L      Potassium 4.5 mEq/L      Chloride 104 mEq/L      CO2 25 mEq/L      Anion Gap 7.0    Hemolysis index [[id number]] Collected:  07/21/16 0949     Updated:  07/21/16 1040     Hemolysis Index 11    GFR [[id number]] Collected:  07/21/16 0949     Updated:  07/21/16 1040     EGFR >60.0    CBC without differential [[id number]]  (Abnormal) Collected:  07/21/16 0949    Specimen:  Blood from Blood Updated:  07/21/16 1002     WBC 21.38 (H) x10  3/uL      Hgb 10.1 (L) g/dL      Hematocrit 44.0 (L) %      Platelets 313 x10 3/uL      RBC 3.86 (L) x10 6/uL      MCV 82.9 fL      MCH 26.2 (L) pg      MCHC 31.6 (L) g/dL      RDW 16 (H) %      MPV 9.4 fL      Nucleated RBC 0.0 /100 WBC      Absolute NRBC 0.00 x10 3/uL           Rads:   Chest 2 Views    Result Date: 07/14/2016       NO SIGNIFICANT ABNORMALITY. [name], MD 07/14/2016 8:43 PM    Xr Elbow Left Ap Lateral And Obliques    Result Date: 07/17/2016   1. Small joint effusion otherwise negative [name], MD 07/17/2016 5:06 PM    Ct Head Wo Contrast    Result Date: 07/14/2016      NO ACUTE INTRACRANIAL ABNORMALITY. [name], MD 07/14/2016 8:47 PM    Ct Cervical Spine Wo Contrast    Result Date: 07/14/2016    SPASM. NO ACUTE FRACTURE. [name], MD 07/14/2016 8:49 PM    Mri Brain W Wo Contrast    Result Date: 07/16/2016   There are a few small nonspecific foci of hyperintensity in the white matter of the right and left cerebral hemispheres. 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 Please review above. [name], MD 07/16/2016 10:17 AM    Shoulder Right 2+ Views    Result Date: 07/14/2016   NO SIGNIFICANT ABNORMALITY. [name], MD 07/14/2016 8:33 PM        Signed by:  Santo Held, FNP-BC  Nurse Practitioner  [facility]  Spectra: [facility] [phone]  [facility] [phone]    *Final recommendations per attending neurologist who will also see patient today and record notes.

## 2016-07-21 NOTE — Progress Notes (Addendum)
DELAY AS OF 7/17    Pending [facility] AR determination.  PT attempted to see pt today yet pt declined and requested a later time of day.  PT will attempt again later this afternoon.  OT updated eval today and recommendation is for AR  Requested [employer] financial office  [phone] to please screen for financial assistance as [facility] AR will require that screening is completed prior to  pt being eligible for charity bed at facility.  Cathy, AR liaison is in the process of following pt for admission.  Dr Calla Kicks updated regarding the above.    Peggyann Shoals  [phone]

## 2016-07-21 NOTE — Plan of Care (Signed)
Problem: Physical Therapy  Goal: By discharge, patient will perform mobility at the patient's highest functional potential. See PT evaluation/note for goals.  Outcome: Progressing  Discharge Recommendation: Acute Rehab, Other (Comment) (otherwise home with 24/7 supervision and HHPT/OT)  DME Recommended for Discharge: Front wheel walker    Is an Occupational Therapy Evaluation Indicated at this time? This patient is already on OT caseload.     Treatment/Interventions: Exercise, Gait training, LE strengthening/ROM, Cognitive reorientation, Patient/family training, Neuromuscular re-education, Equipment eval/education, Continued evaluation  PT Frequency: 3-4x/wk     PMP Activity: Step 6 - Walks in Room  Distance Walked (ft) (Step 6,7): 45 Feet  (Please See Therapy Evaluation for device and assistance level needed)    Goals:   Goals  Goal Formulation: With patient  Time for Goal Acheivement: By time of discharge  Goals: Select goal  Pt Will Perform Sit to Stand: independent, to maximize functional mobility and independence, Partly met  Pt Will Transfer Bed/Chair: independent, to maximize functional mobility and independence, Partly met  Pt Will Ambulate: 151-200 feet, independent, to maximize functional mobility and independence, Partly met

## 2016-07-21 NOTE — PT Progress Note (Signed)
Physical Therapy Note    [facility]  Physical Therapy Attempt Note    Patient:  Wendy Buckley MRN#:  [id number]  Unit:  [address] Room/Bed:  V4098/J1914-N       Physical Therapy treatment attempted on 07/21/2016, unable to complete secondary to patient decline. Patient education of importance of physical therapy with patient continuing to decline. "Not right now my head hurts, I need to eat first can you come back in an hour?"      RN notified. Will follow up at a later time when able.    Reinaldo Raddle, LPTA  [place] Lic# [id number]    Physical Medicine and Rehabilitation  [facility]  [phone]    07/21/2016  11:08 AM

## 2016-07-21 NOTE — Progress Notes (Addendum)
Pt has discharge order in place.    SW spoke with OT who states recommendation for AR remains the recommendation.    SW updated Dr. Antonieta Loveless x 1010 of the above.    SW will follow up with Marcille Buffy [phone] for AR acceptance determination.    SW con't to follow for DCP? Placement issues.    Peggyann Shoals  [phone]

## 2016-07-21 NOTE — OT Eval Note (Signed)
[facility]  Occupational Therapy Evaluation and Treatment    Patient: Wendy Buckley  MRN#: [id number]   Unit: [address]  Bed: V4098/J1914-N    Time of Evaluation and Treatment:  Time Calculation  OT Received On: 07/21/16  Start Time: 1335  Stop Time: 1405  Time Calculation (min): 30 min    Chart Review and Collaboration with Care Team: 8 minutes, not included in above time.    Evaluation: 22 minutes  Treatment:  8 minutes    OT Visit Number: 1    Consult received for Wendy Buckley for OT Evaluation and Treatment.  Patient's medical condition is appropriate for Occupational therapy intervention at this time.    Activity Orders:  OT eval and treat    Precautions and Contraindications:  Precautions  Weight Bearing Status: no restrictions  Other Precautions: fall risk    Medical Diagnosis:  Neck pain [M54.2]  Dizziness [R42]  Elevated blood pressure reading [R03.0]  Injury of head, initial encounter [S09.90XA]  Altered mental status, unspecified altered mental status type [R41.82]  Intractable vomiting with nausea, unspecified vomiting type [R11.2]    History of Present Illness:  Wendy Buckley is a 46 y.o. female admitted on 07/14/2016 with confusion, head pain, and dizziness following work related head trauma. Pt reports no LOC. Empty milk crate fell and struck head/body.    Patient Active Problem List   Diagnosis   . CAP (community acquired pneumonia)   . Hypokalemia   . Hyperglycemia   . Cough   . Wheezing   . Altered mental status   . Mild TBI (traumatic brain injury)        Past Medical/Surgical History:  History reviewed. No pertinent past medical history.  Past Surgical History:   Procedure Laterality Date   . CARPAL TUNNEL RELEASE          X-Rays/Tests/Labs  Lab Results   Component Value Date/Time    HGB 10.1 (L) 07/21/2016 09:49 AM    HCT 32.0 (L) 07/21/2016 09:49 AM    K 4.5 07/21/2016 09:49 AM    NA 136 07/21/2016 09:49 AM    TROPI 0.01 07/14/2016 08:46 PM        All imaging reviewed. Please see chart for details      Social History:  Prior Level of Function  Prior level of function: Independent with ADLs, Ambulates independently  Baseline Activity Level: Community ambulation  Driving: independent  Dressing - Upper Body: independent  Dressing - Lower Body: independent  Cooking: Yes  Feeding: independent  Bathing: independent  Grooming: independent  Toileting: independent    Home Living Arrangements  Living Arrangements: Family members (brother)  Type of Home: House  Home Layout: One level  Bathroom Shower/Tub: Medical sales representative: Standard  Bathroom Equipment: Grab bars in shower  Home Living - Notes / Comments: States brother is home during the day.      Subjective:  Subjective: "I'm at the doctor's office." (re: orientation)   Patient is agreeable to participation in the therapy session. Nursing clears patient for therapy.      Pain Assessment  Pain Assessment: Numeric Scale (0-10)  Pain Score:  (severe, 6 out of 6)  Pain Location: Head, Neck  Pain Descriptors: Tingling, Pins and needles (RUE)      Objective:      Observation of Patient/Vital Signs: vss  Patient received in bed with SCDs in place.    Cognitive Status and Neuro Exam:  Cognition/Neuro Status  Arousal/Alertness: Delayed responses to stimuli  Attention Span: Attends to task with redirection  Orientation Level: Oriented to person, Oriented to situation, Oriented to time, Disoriented to place  Memory: Decreased short term memory, Decreased long term memory  Following Commands: moderate verbal instruction  Safety Awareness: unable to assess  Insights: Decreased awareness of deficits, Educated in safety awareness  Problem Solving: maximum assistance    Musculoskeletal Examination  Gross ROM  Neck/Trunk ROM: reduced by 75%  Right Upper Extremity ROM: needs focused assessment  RUE ROM - Shoulder: reduced by 50%  Left Upper Extremity ROM: within functional limits  Gross Strength  Right Upper  Extremity Strength: unable to assess  Left Upper Extremity Strength: within functional limits       Sensory/Oculomotor Examination  Sensory  Auditory: intact  Tactile - Light Touch: other (comment) (tingling, pins/needles down R arm)  Visual Acuity: impaired, wears glasses         Activities of Daily Living  Self-care and Home Management  Eating: Stand by Assist, Verbal prompting  Grooming: Stand by Assist, verbal prompting  Bathing: Minimal Assist  UB Dressing: Supervision, at edge of bed, sitting  LB Dressing: Minimal Assist  Toileting: Minimal Assist  Functional Transfers: Contact Guard Assist (with FWW (not baseline))  Additional Comments: After session, answering pt's bed alarm, pt was found toileting on own with greater alertness and balance than what was performed during session. Level of assist appears inconsistent.      Functional Mobility:  Mobility and Transfers  Supine to Sit: Stand by Assist  Sit to Stand: Contact Guard Assist (with FWW)  Functional Mobility/Ambulation: Contact Guard Assist (frequent knees buckling (?) but able to recover with FWW)     Balance  Balance  Static Sitting Balance: Supervision  Dyanamic Sitting Balance: Supervision  Static Standing Balance: Contact Guard Assist  Dynamic Standing Balance: Contact Guard Assist    Participation and Activity Tolerance  Participation and Endurance  Participation Effort: good (dec participation d/t TBI; able to participate with cuing)  Endurance: Tolerates 10 - 20 min exercise with multiple rests    Educated the patient to role of occupational therapy, plan of care, goals of therapy and safety with mobility and ADLs, discharge instructions, home safety.    Patient left in bed with SCDs and bed alarm in place and call bell and all personal items/needs within reach. RN notified of session outcome.       Assessment:  Wendy Buckley is a 46 y.o. female admitted 07/14/2016. Patient would benefit from continued skilled OT to address deficits listed  below and increase functional independence.      A Brief chart review was completed including review of labs, review of imaging and review of vitals.  There are few comorbidities or other factors that affect plan of care and require modification of task including:residual neurological symptoms, challenging home enviornment , work demands.  Pt demonstrates performance deficits with feeding, grooming, bathing, dressing, toileting, functional mobility and functional transfers.  Pt's ability to complete ADLs and functional transfers is impaired due to the following deficits:  activity tolerance/endurance, balance, coordination/motor control, dizziness, gait, pain, ROM, strength and safety awareness.      During session, pt with dec alertness and attention, requiring maximal cuing for participation. Levels of assist and observation noted as described above. However, post session, this writer responded to pt's bed alarm. Pt was found ambulating and toileting with greater alertness and balance than what occurred during session. Pt's level  of function appears to be inconsistent.     Complexity Chart  Review Performance  Deficits Clinical Decision  Making Hx/Co-  morbidities Assistance needed   Moderate Expanded 3-5 Several Options 1-2 Min/Mod assist (not at baseline)       From the SCAT5: Sport Concussion Assessment Tool- 5th Edition    Symptom number (of 22): 22  Symptom severity (of 132): 103    Symptoms (Scale 0-6):   Headache:6  "Pressure in head":6  Neck Pain: 6  Nausea or vomiting:3  Dizziness:5  Blurred vision:5  Balance problems:6  Sensitivity to light:4  Sensitivity to noise:5  Feeling slowed down:5  Feeling like "in a fog":5  "Don't feel right":5  Difficulty concentrating:5  Difficulty remembering: 6  Fatigue or low energy:4  Confusion:4  Drowsiness:4  More emotional:4  Irritability:4  Sadness:2  Nervous or anxious:3  Trouble falling asleep:6    Neurological Screen:  Can the pt read aloud the symptom checklist and  follow the instructions without difficulty? no  Does the patient have a full ROM of pain free passive cervical spine movement? no  Without moving their head or neck, can the pt look side-to-side and up-and-down without double vision? no   Can the pt perform the finger-to-nose coordination test normally? no  Can the pt perform tandem gait normally? DNT, pt refused        Treatment:  Ambulated in room x35' with FWW and CGA. Pt with several episodes of possible knee buckling, though pt unable to describe reason for imbalance (fatigue, dizziness, etc). Pt is rec to d/c to short term AR at this time. Pt is agreeable.     Rehabilitation Potential:      Plan:  OT Frequency Recommended: 4-5x/wk   Treatment Interventions: ADL retraining, Functional transfer training, VP retraining, Endurance training, UE strengthening/ROM, Cognitive reorientation, Patient/Family training, Equipment eval/education, Neuro muscular reeducation, Fine motor coordination activities, Compensatory technique education, Continued evaluation     PMP Activity: Step 6 - Walks in Room  Distance Walked (ft) (Step 6,7): 35 Feet    Risks/benefits/POC discussed    G codes: yes  Self Care, Current Status (U9811): CK   Self Care, Goal Status (B1478): CJ        AM-PAC:yes  OT Daily Activity Raw Score: 18  CMS 0-100% Score: 46.65%             Goals:  Time For Goal Achievement: by time of discharge  ADL Goals  Patient will groom self: Supervision  Patient will dress lower body: Stand by Assist  Patient will toilet: Stand by Assist  Mobility and Transfer Goals  Pt will transfer bed to toilet: Stand by Assist        Executive Fucntion Goals  Pt will demonstrate safety: with supervision, to increase ability to complete ADLs  Pt will demonstrate attention to task: with supervision, to increase ability to complete ADLs                    DME Recommended for Discharge: Other (Comment) (defer to next level of care)  Discharge Recommendation: Acute Rehab      Prince Solian, OTR/L [id number]  07/21/2016 2:52 PM

## 2016-07-21 NOTE — Progress Notes (Signed)
[facility]  Speech Therapy Treatment Note    Patient:  Wendy Buckley MRN#:  [id number]  Unit:  [address] Room/Bed:  V4098/J1914-N      Time of treatment:   Start Time: 1115                       Stop Time: 1140      Subjective: Pt with eyes closed at start of tx session, easily aroused given verbal stimuli although continued to present with decreased level of alertness/arousal throughout tx session. Pt reported headache, rated 7/10 for pain, and stated that she had recently received pain medication when clinician inquired whether pt was interested in pain medication.        Objective: Education verbally and via handouts re: sleep hygiene, ways to improve attention and concentration, external aids to improve recall; cognitive-linguistic tx, education and training to utilize appropriate compensatory strategies       Assessment: Pt independently oriented to 1/4 pieces of temporal orientation (month) w/ providing name of current month following slight hesitation, performance increased to pt recalling 2/4 pieces of temporal orientation (+year) given verbal cues to self-correct initial response and increased time. Pt recalled DOW and date immediately following education and training to utilize available environmental aids/tools (i.e., whiteboard in pt's room, pt's cell phone). Pt participated in limited trials of immediate memory tx task (list of 3 words) w/ pt recalling 3/3 words in 75% of opportunities w/ performance decreasing w/ continued trials indicating effect of fatigue as well as continued decreased sustained attention/immediate memory skills.         Plan/Recommendations: Continue w/ POC. ST intervention medically necessary upon d/c from hospital to continue to address cognitive-linguistic deficits.          Charges Minutes   SLP treat 25   Swallow treat        07/21/2016        Eilene Ghazi, MA, CCC-SLP  Extension [phone]

## 2016-07-21 NOTE — Plan of Care (Signed)
Problem: Safety  Goal: Patient will be free from injury during hospitalization  Outcome: Progressing   07/21/16 0215   Goal/Interventions addressed this shift   Patient will be free from injury during hospitalization  Assess patient's risk for falls and implement fall prevention plan of care per policy;Provide and maintain safe environment;Use appropriate transfer methods;Ensure appropriate safety devices are available at the bedside;Include patient/ family/ care giver in decisions related to safety;Hourly rounding;Assess for patients risk for elopement and implement Elopement Risk Plan per policy;Provide alternative method of communication if needed (communication boards, writing)     Goal: Patient will be free from infection during hospitalization  Outcome: Progressing   07/21/16 0215   Goal/Interventions addressed this shift   Free from Infection during hospitalization Assess and monitor for signs and symptoms of infection;Monitor lab/diagnostic results       Problem: Pain  Goal: Pain at adequate level as identified by patient  Outcome: Progressing   07/21/16 0215   Goal/Interventions addressed this shift   Pain at adequate level as identified by patient Identify patient comfort function goal;Assess for risk of opioid induced respiratory depression, including snoring/sleep apnea. Alert healthcare team of risk factors identified.;Assess pain on admission, during daily assessment and/or before any "as needed" intervention(s);Reassess pain within 30-60 minutes of any procedure/intervention, per Pain Assessment, Intervention, Reassessment (AIR) Cycle;Evaluate patient's satisfaction with pain management progress;Evaluate if patient comfort function goal is met;Offer non-pharmacological pain management interventions;Consult/collaborate with Pain Service;Consult/collaborate with Physical Therapy, Occupational Therapy, and/or Speech Therapy;Include patient/patient care companion in decisions related to pain management as  needed       Problem: Discharge Barriers  Goal: Patient will be discharged home or other facility with appropriate resources  Outcome: Progressing   07/21/16 0215   Goal/Interventions addressed this shift   Discharge to home or other facility with appropriate resources Provide appropriate patient education;Provide information on available health resources;Initiate discharge planning       Problem: Psychosocial and Spiritual Needs  Goal: Demonstrates ability to cope with hospitalization/illness  Outcome: Progressing   07/21/16 0215   Goal/Interventions addressed this shift   Demonstrates ability to cope with hospitalizations/illness Encourage verbalization of feelings/concerns/expectations;Provide quiet environment;Assist patient to identify own strengths and abilities;Encourage patient to set small goals for self;Encourage participation in diversional activity;Reinforce positive adaptation of new coping behaviors;Include patient/ patient care companion in decisions;Communicate referral to spiritual care as appropriate       Problem: Moderate/High Fall Risk Score >5  Goal: Patient will remain free of falls  Outcome: Progressing   07/21/16 0215   OTHER   Moderate Risk (6-13) LOW-Fall Interventions Appropriate for Low Fall Risk;LOW-Anticoagulation education for injury risk       Problem: Neurological Deficit  Goal: Neurological status is stable or improving  Outcome: Progressing      Problem: Anxiety  Goal: Anxiety is at a manageable level  Outcome: Progressing   07/21/16 0215   Goal/Interventions addressed this shift   Anxiety is at a manageable level Orient to unit;Inform/explain to patient/patient care companion all tests/procedures/treatment/care prior to initiation;Facilitate expression of feelings, fears, concerns, anxiety;Assess emotional status and coping mechanisms;Provide and maintain a safe environment;Provide alternatives to reduce anxiety;Limit or eliminate stimulants such as caffeine and  nicotine;Consult/collaborate with ancillary departments;Include patient/patient care companion in decisions related to anxiety/depression;Assist in developing anxiety-reducing skills;Provide emotional support;Encourage participation in care       Problem: Altered GI Function  Goal: Elimination patterns are normal or improving  Outcome: Progressing   07/21/16 0215   Goal/Interventions addressed  this shift   Elimination patterns are normal or improving Report abnormal assessment to physician;Anticipate/assist with toileting needs;Monitor for abdominal discomfort;Monitor for abdominal distension;Assess for signs and symptoms of bleeding. Report signs of bleeding to physician;Administer treatments as ordered;Collaborate with LIP for containment device;Reinforce education on foods that improve and complicate bowel movements and how activity and medications can affect bowel movements;Administer medications to improve bowel evacuation as prescribed;Encourage /perform oral hygiene as appropriate

## 2016-07-21 NOTE — Plan of Care (Signed)
Problem: Neurological Deficit  Goal: Neurological status is stable or improving  Outcome: Progressing   07/21/16 1237   Goal/Interventions addressed this shift   Neurological status is stable or improving Observe for seizure activity and initiate seizure precautions if indicated;Perform CAM Assessment     Drowsy most of time. Easily aroused by verbal. Reports having persistent headache. On  Magnesium iv , Robaxin, Toradol q6h along with compazine for headache & nausea.  no vomiting during  the shift so far. Has poor appetite d/t nausea.  Vss. No sob or respiratory distress. Will continue to monitor. Discharge planning to rehab in progress. Will continue to monitor.

## 2016-07-21 NOTE — Plan of Care (Signed)
Problem: Safety  Goal: Patient will be free from injury during hospitalization  Outcome: Progressing   07/21/16 2127   Goal/Interventions addressed this shift   Patient will be free from injury during hospitalization  Assess patient's risk for falls and implement fall prevention plan of care per policy;Use appropriate transfer methods;Provide and maintain safe environment;Ensure appropriate safety devices are available at the bedside;Include patient/ family/ care giver in decisions related to safety;Hourly rounding;Assess for patients risk for elopement and implement Elopement Risk Plan per policy;Provide alternative method of communication if needed (communication boards, writing)     Goal: Patient will be free from infection during hospitalization  Outcome: Progressing   07/21/16 2127   Goal/Interventions addressed this shift   Free from Infection during hospitalization Assess and monitor for signs and symptoms of infection;Monitor lab/diagnostic results       Problem: Side Effects from Pain Analgesia  Goal: Patient will experience minimal side effects of analgesic therapy  Outcome: Progressing   07/21/16 2127   Goal/Interventions addressed this shift   Patient will experience minimal side effects of analgesic therapy Monitor/assess patient's respiratory status (RR depth, effort, breath sounds);Assess for changes in cognitive function;Prevent/manage side effects per LIP orders (i.e. nausea, vomiting, pruritus, constipation, urinary retention, etc.);Evaluate for opioid-induced sedation with appropriate assessment tool (i.e. POSS)       Problem: Discharge Barriers  Goal: Patient will be discharged home or other facility with appropriate resources  Outcome: Progressing   07/21/16 2127   Goal/Interventions addressed this shift   Discharge to home or other facility with appropriate resources Provide appropriate patient education;Provide information on available health resources;Initiate discharge planning        Problem: Psychosocial and Spiritual Needs  Goal: Demonstrates ability to cope with hospitalization/illness  Outcome: Progressing   07/21/16 2127   Goal/Interventions addressed this shift   Demonstrates ability to cope with hospitalizations/illness Encourage verbalization of feelings/concerns/expectations;Provide quiet environment;Assist patient to identify own strengths and abilities;Encourage patient to set small goals for self;Encourage participation in diversional activity;Reinforce positive adaptation of new coping behaviors;Include patient/ patient care companion in decisions;Communicate referral to spiritual care as appropriate       Problem: Moderate/High Fall Risk Score >5  Goal: Patient will remain free of falls  Outcome: Progressing   07/21/16 2127   OTHER   Moderate Risk (6-13) LOW-Fall Interventions Appropriate for Low Fall Risk;LOW-Anticoagulation education for injury risk       Problem: Neurological Deficit  Goal: Neurological status is stable or improving   07/21/16 2127   Goal/Interventions addressed this shift   Neurological status is stable or improving Monitor/assess/document neurological assessment (Stroke: every 4 hours);Monitor/assess NIH Stroke Scale;Observe for seizure activity and initiate seizure precautions if indicated;Perform CAM Assessment       Problem: Impaired Mobility  Goal: Mobility/Activity is maintained at optimal level for patient  Outcome: Progressing   07/21/16 2127   Goal/Interventions addressed this shift   Mobility/activity is maintained at optimal level for patient Increase mobility as tolerated/progressive mobility;Encourage independent activity per ability;Perform active/passive ROM;Maintain proper body alignment;Plan activities to conserve energy, plan rest periods;Reposition patient every 2 hours and as needed unless able to reposition self;Assess for changes in respiratory status, level of consciousness and/or development of fatigue;Consult/collaborate with  Physical Therapy and/or Occupational Therapy       Problem: Anxiety  Goal: Anxiety is at a manageable level  Outcome: Progressing   07/21/16 2127   Goal/Interventions addressed this shift   Anxiety is at a manageable level Orient to unit;Inform/explain to  patient/patient care companion all tests/procedures/treatment/care prior to initiation;Assess emotional status and coping mechanisms;Facilitate expression of feelings, fears, concerns, anxiety;Provide alternatives to reduce anxiety;Provide and maintain a safe environment;Limit or eliminate stimulants such as caffeine and nicotine;Consult/collaborate with ancillary departments;Include patient/patient care companion in decisions related to anxiety/depression;Provide emotional support;Encourage participation in care;Assist in developing anxiety-reducing skills

## 2016-07-22 NOTE — Progress Notes (Signed)
[facility]  Speech Therapy Treatment Note    Patient:  Wendy Buckley MRN#:  [id number]  Unit:  [address] [facility] Room/Bed:  Y4034/V4259-D      Time of treatment:   Start Time: 1405                       Stop Time: 1435      Subjective: Pt awake and alert, c/o headache and arm pain from where IV was removed.        Objective: Cognitive-linguistic tx, development and training to utilize appropriate compensatory memory and attention strategies         Assessment: Pt presented with improved performance in today's vs. yesterday tx session. Significantly improved level of alertness and arousal as well as sustained attention. Pt stated that she did not remember working with SLP yesterday. Pt oriented to 1/4 pieces of temporal orientation (month) w/ independent utilization of whiteboard on wall. Pt recalled year given min verbal cues in addition to several cues to self-correct. Pt required and benefited from re-education of utilization of available tools (e.g., phone) to improve temporal orientation and to recall DOW and date. Significantly improved immediate memory w/ pt recalling 3/3 words in 95% of opportunities during immediate memory tx task. Slight improvement in delayed recall from initial testing w/ MoCA w/ pt consistently recalling 1/3 words following 3-5 minute delay w/ distractions in between. Pt requested pain medication for her headache at end of tx session. RN aware.        Plan/Recommendations: Continue to address cognitive-linguistic deficits. ST intervention medically necessary upon d/c from hospital to continue to address cognitive-linguistic deficits.            Charges Minutes   SLP treat 30   Swallow treat        07/22/2016        Eilene Ghazi, MA, CCC-SLP  Extension [phone]

## 2016-07-22 NOTE — Plan of Care (Signed)
Problem: Safety  Goal: Patient will be free from injury during hospitalization  Outcome: Progressing   07/22/16 1233   Goal/Interventions addressed this shift   Patient will be free from injury during hospitalization  Assess patient's risk for falls and implement fall prevention plan of care per policy;Provide and maintain safe environment;Ensure appropriate safety devices are available at the bedside;Include patient/ family/ care giver in decisions related to safety;Hourly rounding;Use appropriate transfer methods;Assess for patients risk for elopement and implement Elopement Risk Plan per policy   Provide and Maintain safe environment. Easy access to bed controls, call bell and personal care items provided. Floor mat in placed.Keep the bed in the lowest position with wheels locked. bed alarm activated. Monitor patients frequently. Comfort and feeling of security provided. will continue to monitor pt's safety.    Problem: Neurological Deficit  Goal: Neurological status is stable or improving  Outcome: Progressing   07/22/16 1233   Goal/Interventions addressed this shift   Neurological status is stable or improving Monitor/assess/document neurological assessment (Stroke: every 4 hours);Monitor/assess NIH Stroke Scale;Perform CAM Assessment   S/E by Dr. Calla Kicks, AOX4, afebrile, pt denies of nausea and vomiting, no respiratory and no SOB noted .awaiting for placement for rehab. Pt is settled. Will cont to monitor pt.    Problem: Anxiety  Goal: Anxiety is at a manageable level  Outcome: Progressing   07/22/16 1233   Goal/Interventions addressed this shift   Anxiety is at a manageable level Provide and maintain a safe environment;Provide alternatives to reduce anxiety;Provide emotional support;Encourage participation in care

## 2016-07-22 NOTE — Progress Notes (Signed)
[facility] Inpatient Rehab Note:  No, unable to accept patient.  The pt does not meet criteria for acute rehab.  Pt does not require the intensity needed for inpatient acute rehab. Would recommend Home Health at this time.   Clydie Braun [place] MSN, RN-BC, CRRN, [employer]

## 2016-07-22 NOTE — Discharge Summary (Signed)
[facility]      Patient: Wendy Buckley  Admission Date: 07/14/2016   DOB: [birth year]  Discharge Date:    MRN: [id number]  Discharge Attending:Nykayla Marcelli  Wendy Buckley     Referring Physician: [name], MD  PCP: [name], MD       DISCHARGE SUMMARY     Discharge Information   Admission Diagnosis:   Mild TBI (traumatic brain injury)    Discharge Diagnosis:   Active Hospital Problems    Diagnosis   . 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 Acute metabolic encephalopathy secondary to concussion  Leukocytosis secondary to steroids           Admission Condition: Guarded  Discharge Condition: Stable  Consultants: Neurology  Functional Status: Ambulatory  Discharged to: Home    Discharge Medications:     Medication List      START taking these medications    acetaminophen 325 MG tablet  Commonly known as:  TYLENOL  Take 2 tablets (650 mg total) by mouth every 6 (six) hours as needed for Pain.  Notes to patient:  Last dose given:   Next dose due:      amitriptyline 25 MG tablet  Commonly known as:  ELAVIL  Take 1 tablet (25 mg total) by mouth nightly.  Notes to patient:  Last dose given:   Next dose due:      gabapentin 400 MG capsule  Commonly known as:  NEURONTIN  Take 2 capsules (800 mg total) by mouth every 8 (eight) hours.  Notes to patient:  Last dose given:   Next dose due:      methocarbamol 500 MG tablet  Commonly known as:  ROBAXIN  Take 1 tablet (500 mg total) by mouth 3 (three) times daily as needed (headache).  Notes to patient:  Last dose given:   Next dose due:      ondansetron 4 MG disintegrating tablet  Commonly known as:  ZOFRAN-ODT  Take 1 tablet (4 mg total) by mouth every 6 (six) hours as needed for Nausea.  Notes to patient:  Last dose given:   Next dose due:      polyethylene glycol packet  Commonly known as:  MIRALAX  Take 17 g by mouth daily as needed (constipation).  Notes to patient:  Last dose given:   Next dose due:      zolpidem 5 MG  tablet  Commonly known as:  AMBIEN  Take 1 tablet (5 mg total) by mouth nightly as needed for Sleep.  Notes to patient:  Last dose given:   Next dose due:            Where to Get Your Medications      Information about where to get these medications is not yet available    Ask your nurse or doctor about these medications   acetaminophen 325 MG tablet   amitriptyline 25 MG tablet   gabapentin 400 MG capsule   methocarbamol 500 MG tablet   ondansetron 4 MG disintegrating tablet   polyethylene glycol packet   zolpidem 5 MG tablet             Hospital Course   Presentation History / Hospital Course (5 Days):S/p head injury resulting in HA, dizziness, confusion/disorientation in Wendy 46 y/o female with no significant PMH. MRI brain without signfiicant findings. rEEG without seizures. Confusion/mental status improving though HA, dizziness, and vomiting persist.  Suspect Wendy post concussive syndrome (symptoms can last  months after Wendy mild TBI) . Patient was treated for headache with Toradol 30 mg q8h, Compazine 10 mg TID, Benadryl 12.5 mg TID, Magnesium 1 gm BID, Depakote 500 mg q12h (ordered), Solumedrol 500 mg IV daily, Elavil 25 mg qhs  Robaxin QID,Neurontin 800 mg TID but made minimal progress.She was seen by PT/OT/speech therapy and it was suggested that she would benefit from acute rehab since her symptoms could last for several weeks.   Patient has persistent symptoms, she was not accepted at the acute rehabilitation.  I left Wendy couple messages for her brother to call me back, this point she does not appear safe for discharge home due to continuous complaints of dizziness, headache, nausea  We will scale back on the amount of medication she is receiving, discussed with neurology her outpatient regimen  Case management continues to work on discharge planning    RECOMMENDATIONS:    - Patient was informed of abnormal and incidental imaging findings during hospitalization, and advised to review this information with  their  medical provider.    Procedures/Imaging:   Chest 2 Views    Result Date: 07/14/2016       NO SIGNIFICANT ABNORMALITY. [name], MD 07/14/2016 8:43 PM    Xr Elbow Left Ap Lateral And Obliques    Result Date: 07/17/2016   1. Small joint effusion otherwise negative [name], MD 07/17/2016 5:06 PM    Ct Head Wo Contrast    Result Date: 07/14/2016      NO ACUTE INTRACRANIAL ABNORMALITY. [name], MD 07/14/2016 8:47 PM    Ct Cervical Spine Wo Contrast    Result Date: 07/14/2016    SPASM. NO ACUTE FRACTURE. [name], MD 07/14/2016 8:49 PM    Mri Brain W Wo Contrast    Result Date: 07/16/2016   There are Wendy few small nonspecific foci of hyperintensity in the white matter of the right and left cerebral hemispheres. 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 Please review above. [name], MD 07/16/2016 10:17 AM    Shoulder Right 2+ Views    Result Date: 07/14/2016   NO SIGNIFICANT ABNORMALITY. [name], MD 07/14/2016 8:33 PM     Echo Results     None               Best Practices   Was the patient admitted with either Wendy CHF Exacerbation or Pneumonia? NO     Progress Note/Physical Exam at Discharge     Has persistent symptoms of headache, photosensitivity, difficulty sleeping, dizziness, occasional nausea    Vitals:    07/21/16 2305 07/22/16 0805 07/22/16 1226 07/22/16 1533   BP: 109/64 106/58 104/66 120/58   Pulse: 93 89 88 89   Resp: 18 16 16 16    Temp:  97.3 F (36.3 C) 97.6 F (36.4 C) 97.2 F (36.2 C)   TempSrc: Oral Oral Oral    SpO2: 98% 97% 99% 98%   Weight:       Height:           General: NAD  HEENT: sclera anicteric, OP: Clear, MMM  Cardiovascular: RRR, no m/r/g  Lungs: CTAB, no w/r/r  Abdomen: soft, +BS, NT/ND, no masses, no g/r  Extremities: Warm and well perfused  Skin: no rashes or lesions noted on exposed surfaces  Neuro: Answers questions appropriately, responds to commands       Diagnostics     Labs/Studies Pending at Discharge: NO  Last Labs     Recent  Labs  Lab 07/21/16  0949 07/17/16  1334   WBC 21.38* 11.50*   RBC 3.86* 4.31   Hgb 10.1* 11.3*   Hematocrit 32.0* 36.5*   MCV 82.9 84.7   Platelets 313 340         Recent Labs  Lab 07/21/16  0949 07/17/16  1334   Sodium 136 137   Potassium 4.5 4.4   Chloride 104 108   CO2 25 21*   BUN 25.0* 15.0   Creatinine 0.7 0.9   Glucose 97 104*   Calcium 9.3 9.3       Microbiology Results     None           Patient Instructions   Discharge Diet:   Discharge Activity: As tolerated  LABS/TESTING after discharge    Follow Up Appointment:  Follow-up Information     [facility]. Schedule an appointment as soon as possible for Wendy visit in 1 week(s).    Contact information:  [address]  [address]  [place] [address]  [phone]           Vivi Martens, MD PhD. Schedule an appointment as soon as possible for Wendy visit in 1 week(s).    Specialty:  Neurology  Contact information:  [address]  [place] [address]  [phone]             [name], MD .                  Time spent examining patient, discussing with patient/family regarding hospital course, chart review, reconciling medications and discharge planning: > 35 minutes.  This patient was examined by me on , the day of discharge.    Signed,  Sheria Lang Wilbert Schouten    6:12 PM 07/22/2016

## 2016-07-22 NOTE — Progress Notes (Signed)
Pt alert and oriented x 4. ambu with a steady gait. Lost IV but patient refused any further IV insertions. Dr Pleas Koch was notified that pt has depakote IV bag and mag to infuse. Depakote was changed to PO and given. Pt received her scheduled and prn meds. Per req myralax was given.

## 2016-07-22 NOTE — Progress Notes (Signed)
07/22/2016  Denied for [facility] Acute rehab - referral placed to [facility] and they are evaluating.  DCP: delay due to placement  Van Clines RN, [place]  Case Management  [phone]

## 2016-07-23 MED ORDER — SENNOSIDES-DOCUSATE SODIUM 8.6-50 MG PO TABS
2.0000 | ORAL_TABLET | Freq: Two times a day (BID) | ORAL | Status: DC | PRN
Start: 2016-07-23 — End: 2016-07-28

## 2016-07-23 MED ORDER — AMITRIPTYLINE HCL 25 MG PO TABS
50.0000 mg | ORAL_TABLET | Freq: Every evening | ORAL | Status: DC
Start: 2016-07-23 — End: 2016-07-28
  Administered 2016-07-23 – 2016-07-27 (×5): 50 mg via ORAL
  Filled 2016-07-23 (×5): qty 2

## 2016-07-23 MED ORDER — FLEET ENEMA 7-19 GM/118ML RE ENEM
1.0000 | ENEMA | Freq: Once | RECTAL | Status: DC
Start: 2016-07-24 — End: 2016-07-28
  Filled 2016-07-23: qty 1

## 2016-07-23 MED ORDER — BISACODYL 5 MG PO TBEC
10.0000 mg | DELAYED_RELEASE_TABLET | Freq: Once | ORAL | Status: AC
Start: 2016-07-23 — End: 2016-07-23
  Administered 2016-07-23: 19:00:00 10 mg via ORAL
  Filled 2016-07-23: qty 2

## 2016-07-23 MED ORDER — ALUM & MAG HYDROXIDE-SIMETH 200-200-20 MG/5ML PO SUSP
15.0000 mL | ORAL | Status: DC | PRN
Start: 2016-07-23 — End: 2016-07-28
  Administered 2016-07-23 – 2016-07-25 (×5): 15 mL via ORAL
  Filled 2016-07-23 (×5): qty 30

## 2016-07-23 MED ORDER — POLYETHYLENE GLYCOL 3350 17 G PO PACK
17.0000 g | PACK | Freq: Every day | ORAL | Status: DC
Start: 2016-07-23 — End: 2016-07-28
  Administered 2016-07-23 – 2016-07-28 (×3): 17 g via ORAL
  Filled 2016-07-23 (×4): qty 1

## 2016-07-23 MED ORDER — GABAPENTIN 300 MG PO CAPS
900.0000 mg | ORAL_CAPSULE | Freq: Three times a day (TID) | ORAL | Status: DC
Start: 2016-07-23 — End: 2016-07-26
  Administered 2016-07-23 – 2016-07-26 (×10): 900 mg via ORAL
  Filled 2016-07-23 (×11): qty 3

## 2016-07-23 NOTE — OT Progress Note (Signed)
[facility]   Occupational Therapy Attempt Note    Patient:  Wendy Buckley MRN#:  [id number]  Unit:  [address] [facility] Room/Bed:  V4098/J1914-N      Occupational Therapy treatment attempted on 07/23/2016, unable to complete secondary to pt refusal. Pt reporting headache pain and refuses therapist attempt to encourage only activity as tolerated.      RN aware of pt reports. Will f/u as able.    [place], MSOT, OTR/L [id number]  07/23/2016 4:57 PM

## 2016-07-23 NOTE — Plan of Care (Signed)
Problem: Safety  Goal: Patient will be free from injury during hospitalization  Outcome: Progressing  The learning abilities of the patient hasbeen assessed. Today's individualized plan of care includes adhering to the fall prevention plan and precautions. The patient agrees to the plan of care and verbalized understanding of the disease process, treatment plan, medications, and consequences of noncompliance. All questions and concerns were addressed. Pt verbalized understanding of using the call light to notify staff when needing assistance, wearing nonskid footwear, wearing fall alert armband and having the floor mat at bedside. Pt is hourly rounded on and needs are addressed. Pt is independent.        07/23/16 1600   Goal/Interventions addressed this shift   Patient will be free from injury during hospitalization  Assess patient's risk for falls and implement fall prevention plan of care per policy;Provide and maintain safe environment;Include patient/ family/ care giver in decisions related to safety;Hourly rounding       Problem: Pain  Goal: Pain at adequate level as identified by patient  Outcome: Progressing  The learning abilities of the patient has been assessed. Today's individualized plan of care includes monitoring and treating pain.  The patient agrees to the plan of care and verbalized understanding of the disease process, treatment plan, medications, and consequences of noncompliance. All questions and concerns were addressed. Pt's medication regimen adjusted (increased Neurontin dosage and Elavil dosage). Pt has PRN Percocet 1 tab q4h PRN.        07/23/16 1600   Goal/Interventions addressed this shift   Pain at adequate level as identified by patient Identify patient comfort function goal;Assess pain on admission, during daily assessment and/or before any "as needed" intervention(s);Reassess pain within 30-60 minutes of any procedure/intervention, per Pain Assessment, Intervention, Reassessment (AIR)  Cycle;Evaluate if patient comfort function goal is met;Evaluate patient's satisfaction with pain management progress;Offer non-pharmacological pain management interventions;Consult/collaborate with Pain Service;Consult/collaborate with Physical Therapy, Occupational Therapy, and/or Speech Therapy       Problem: Neurological Deficit  Goal: Neurological status is stable or improving  Outcome: Progressing  The learning abilities of the patient has been assessed. Today's individualized plan of care includes monitoring neurological status.  The patient agrees to the plan of care and verbalized understanding of the disease process, treatment plan, medications, and consequences of noncompliance. All questions and concerns were addressed. Pt's has short term memory loss. Neurochecks otherwise unremarkable.        07/23/16 1600   Goal/Interventions addressed this shift   Neurological status is stable or improving Monitor/assess/document neurological assessment (Stroke: every 4 hours);Perform CAM Assessment       Problem: Compromised Hemodynamic Status  Goal: Vital signs and fluid balance maintained/improved  Outcome: Progressing  The learning abilities of the patient has been assessed. Today's individualized plan of care includes monitoring VS and fluid balance.  The patient agrees to the plan of care and verbalized understanding of the disease process, treatment plan, medications, and consequences of noncompliance. All questions and concerns were addressed. Pt's VSS on RA. Labs followed. Intake and output recorded.     07/23/16 1600   Goal/Interventions addressed this shift   Vital signs and fluid balance are maintained/improved Position patient for maximum circulation/cardiac output;Monitor/assess vitals and hemodynamic parameters with position changes;Monitor and compare daily weight;Monitor intake and output. Notify LIP if urine output is less than 30 mL/hour.;Monitor/assess lab values and report abnormal values

## 2016-07-23 NOTE — Progress Notes (Signed)
[facility]  Speech Therapy Treatment Note    Patient:  Wendy Buckley MRN#:  [id number]  Unit:  [facility] Room/Bed:  V4098/J1914-N        Time of treatment:   Start Time: 1415                       Stop Time: 1440      Subjective: Pt awake and alert w/ c/o headache, rated 8/10 for pain. Pt requesting to see her dr. Clinician contacted RN, who stated that the dr was making rounds. Message passed on to pt. Pt inquiring re: d/c plans.        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 Reviewed previous information verbally and via handouts w/ pt, including importance of sleep hygiene, ways to improve attention and concentration, and external aids to improve recall. Pt independently utilized phone to recall month, DOW, and date although required verbal cues to self-correct (i.e., stated DOW when asked about month). Pt unable to recall year, educated and trained to utilize phone calendar to improve recall of year. Pt participated in paper and pencil tx task re: medication management since pt stated that she is going to be in charge of her medications upon d/c discharge with 80% accuracy given constant verbal cues to self-correct. Decreased self-monitoring and self-correction common symptom w/ TBI. Pt educated re: importance of having assistance w/ medication and finance management at this time secondary to cognitive-linguistic deficits. Pt also encouraged to keep daily log to write down events during the day to improve recall. Pt verbalized fair understanding of all education and review provided, will benefit from continued re-education.        Plan/Recommendations: Continue w/ POC. ST intervention continues to be medically necessary upon d/c from hospital.           Charges Minutes   SLP treat 25   Swallow treat        07/23/2016        Eilene Ghazi, MA, CCC-SLP  Extension [phone]

## 2016-07-23 NOTE — Progress Notes (Signed)
07/23/2016  Spoke with [facility] liaison and pt has been accepted by [facility] and are waiting for the auth from [employer].  DCP: [facility] when auth received.  Van Clines RN, [place]  Case Management  [phone]

## 2016-07-23 NOTE — UM Notes (Signed)
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  .Acute metabolic encephalopathy secondary to concussion  Leukocytosis secondary to steroids     07/23/16 0852 -- 98.1 F (36.7 C) Oral  101 98 % -- 18 102/57  07/22/16 0805 -- 97.3 F (36.3 C) Oral 89 97 % -- 16 106/58 --  07/21/16 0800 --  96.8 F (36 C) -- 76 99 % -- 18 119/69  07/20/16 0652 -- 97.4 F (36.3 C) -- 78 99 % -- 17 117/72 --  07/19/16 0659 -- 97.1 F (36.2 C) Oral 69 98 % Monitor 18 135/81  07/18/16 0700 -- 97.6 F (36.4 C) Oral -- 98 % -- 18 121/76    Results     Procedure Component Value Units Date/Time    Basic Metabolic Panel [[id number]]  (Abnormal) Collected:  07/21/16 0949     BUN 25.0 (H) mg/dL     CBC without differential [[id number]]  (Abnormal) Collected:  07/21/16 0949     WBC 21.38 (H) x10 3/uL      Hgb 10.1 (L) g/dL      Hematocrit 78.2 (L) %      RBC 3.86 (L) x10 6/uL      MCH 26.2 (L) pg      MCHC 31.6 (L) g/dL      RDW 16 (H) %           MD note7/19/18  Presentation History / Hospital Course (6 Days):S/p head injury resulting in HA, dizziness, confusion/disorientation in a 46 y/o female with no significant PMH. MRI brain without signfiicant findings. rEEG without seizures. Confusion/mental status improving though HA, dizziness, and vomiting persist. Suspect a post concussive syndrome (symptoms can last months after a mild TBI) . Patient was treated for headache with Toradol 30 mg q8h, Compazine 10 mg TID, Benadryl 12.5 mg TID, Magnesium 1 gm BID, Depakote 500 mg q12h (ordered), Solumedrol 500 mg IV daily, Elavil 25 mg qhs  Robaxin QID,Neurontin 800 mg TID but made minimal progress.She was seen by PT/OT/speech therapy and it was suggested that she would benefit from acute rehab since her symptoms could last for several weeks.   Patient has persistent symptoms, she was not accepted at the acute rehabilitation.  I left a couple  messages for her brother to call me back, this point she does not appear safe for discharge home due to continuous complaints of dizziness, headache, nausea  We will scale back on the amount of medication she is receiving, discussed with neurology her outpatient regimen  Case management continues to work on discharge planning  7/19- symptoms remain the same, she looks better but states she does not feel much better; reports constipation and insomnia; started on Dulcolax, MiraLAX and increase amitriptyline to 50 mg a day; discharge planning.  Still pending  RECOMMENDATIONS:  - Patient was informed of abnormal and incidental imaging findings during hospitalization, and advised to review this information with their  medical provider.      Scheduled Meds:  Current Facility-Administered Medications   Medication Dose Route Frequency   . amitriptyline  50 mg Oral QHS   . divalproex sodium  500 mg Oral Q12H SCH   . docusate sodium  100 mg Oral Q12H   . famotidine  20 mg Oral Q12H SCH   . gabapentin  900 mg Oral Q8H   . heparin (porcine)  5,000 Units Subcutaneous Q12H [facility]   .  lidocaine   Topical TID   . methocarbamol  500 mg Oral QID   . polyethylene glycol  17 g Oral Daily     Continuous Infusions:  PRN Meds:.acetaminophen, naloxone, ondansetron  4 mg po q 6 hr x 1 , oxycodone 1 tab q 4 hr x 3, polyethylene glycol, zolpidem 5 mg po x 2    Eddie North,  BSN, RN  Utilization Review Case Manager  Case Management Department  [facility]  T [phone] Judie Petit [phone]   [email or web address]  Please submit all clinical review request via fax to [phone]

## 2016-07-23 NOTE — Plan of Care (Signed)
Problem: Safety  Goal: Patient will be free from injury during hospitalization  Outcome: Progressing   07/23/16 0403   Goal/Interventions addressed this shift   Patient will be free from injury during hospitalization  Assess patient's risk for falls and implement fall prevention plan of care per policy;Provide and maintain safe environment;Use appropriate transfer methods;Hourly rounding;Include patient/ family/ care giver in decisions related to safety   Fall precautions in place. Call light within reach. Pt instructed to call for assist before getting OOB and she verbalized. Will continue to monitor pt for changes.    Problem: Neurological Deficit  Goal: Neurological status is stable or improving  Outcome: Progressing   07/23/16 0403   Goal/Interventions addressed this shift   Neurological status is stable or improving Monitor/assess/document neurological assessment (Stroke: every 4 hours)       Problem: Altered GI Function  Goal: Elimination patterns are normal or improving  Outcome: Progressing   07/23/16 0403   Goal/Interventions addressed this shift   Elimination patterns are normal or improving Report abnormal assessment to physician;Anticipate/assist with toileting needs;Assess for normal bowel sounds;Monitor for abdominal distension;Monitor for abdominal discomfort;Assess for flatus

## 2016-07-23 NOTE — Progress Notes (Addendum)
07/23/2016  Pt is unable to remember which [employer] she works at. Noted worker's comp claim # [id number] in a CM progress note but no contact numbers or company name.  Spoke with admissions and pt works at [employer] on [address] in [place] [phone]. Call placed to manager there and given the number for employees wc [phone].  Call placed and confirmed correct claim # but no agent has been assigned to the case.  Told to call [phone] and vm left.  Call placed to April at [facility]) [phone] and given the updated info.  She is going to contact the [facility] agency to see about getting an auth for acute rehab.        UPDATE:  Spoke with Earnie Larsson [phone] who referred this CM to Nurse CM Raiford Noble [phone] who is starting the process for the pt.  She is requesting clinicals be faxed to [phone] which was done.  Claim adjuster is Venia Carbon (p) [phone] and fax [phone].  Billing address is [address] [address], [place], [place] [address]. Information to be provided to admissions at hospital.  Call to April at [facility] with update and am anticipating an [place] tomorrow.  Van Clines RN, [place]  Case Management  [phone]

## 2016-07-23 NOTE — Discharge Summary (Signed)
[facility]      Patient: Wendy Buckley  Admission Date: 07/14/2016   DOB: 01-Mar-1970  Discharge Date:    MRN: [id number]  Discharge Attending:[name]  A [name]     Referring Physician: [name], MD  PCP: [name], MD       DISCHARGE SUMMARY     Discharge Information   Admission Diagnosis:   Mild TBI (traumatic brain injury)    Discharge Diagnosis:   Active Hospital Problems    Diagnosis   . 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 Acute metabolic encephalopathy secondary to concussion  Leukocytosis secondary to steroids           Admission Condition: Guarded  Discharge Condition: Stable  Consultants: Neurology  Functional Status: Ambulatory  Discharged to: Home    Discharge Medications:     Medication List      START taking these medications    acetaminophen 325 MG tablet  Commonly known as:  TYLENOL  Take 2 tablets (650 mg total) by mouth every 6 (six) hours as needed for Pain.  Notes to patient:  Last dose given:   Next dose due:      amitriptyline 25 MG tablet  Commonly known as:  ELAVIL  Take 1 tablet (25 mg total) by mouth nightly.  Notes to patient:  Last dose given:   Next dose due:      gabapentin 400 MG capsule  Commonly known as:  NEURONTIN  Take 2 capsules (800 mg total) by mouth every 8 (eight) hours.  Notes to patient:  Last dose given:   Next dose due:      methocarbamol 500 MG tablet  Commonly known as:  ROBAXIN  Take 1 tablet (500 mg total) by mouth 3 (three) times daily as needed (headache).  Notes to patient:  Last dose given:   Next dose due:      ondansetron 4 MG disintegrating tablet  Commonly known as:  ZOFRAN-ODT  Take 1 tablet (4 mg total) by mouth every 6 (six) hours as needed for Nausea.  Notes to patient:  Last dose given:   Next dose due:      polyethylene glycol packet  Commonly known as:  MIRALAX  Take 17 g by mouth daily as needed (constipation).  Notes to patient:  Last dose given:   Next dose due:      zolpidem 5 MG  tablet  Commonly known as:  AMBIEN  Take 1 tablet (5 mg total) by mouth nightly as needed for Sleep.  Notes to patient:  Last dose given:   Next dose due:            Where to Get Your Medications      Information about where to get these medications is not yet available    Ask your nurse or doctor about these medications   acetaminophen 325 MG tablet   amitriptyline 25 MG tablet   gabapentin 400 MG capsule   methocarbamol 500 MG tablet   ondansetron 4 MG disintegrating tablet   polyethylene glycol packet   zolpidem 5 MG tablet             Hospital Course   Presentation History / Hospital Course (6 Days):S/p head injury resulting in HA, dizziness, confusion/disorientation in a 46 y/o female with no significant PMH. MRI brain without signfiicant findings. rEEG without seizures. Confusion/mental status improving though HA, dizziness, and vomiting persist.  Suspect a post concussive syndrome (symptoms can last  months after a mild TBI) . Patient was treated for headache with Toradol 30 mg q8h, Compazine 10 mg TID, Benadryl 12.5 mg TID, Magnesium 1 gm BID, Depakote 500 mg q12h (ordered), Solumedrol 500 mg IV daily, Elavil 25 mg qhs  Robaxin QID,Neurontin 800 mg TID but made minimal progress.She was seen by PT/OT/speech therapy and it was suggested that she would benefit from acute rehab since her symptoms could last for several weeks.   Patient has persistent symptoms, she was not accepted at the acute rehabilitation.  I left a couple messages for her brother to call me back, this point she does not appear safe for discharge home due to continuous complaints of dizziness, headache, nausea  We will scale back on the amount of medication she is receiving, discussed with neurology her outpatient regimen  Case management continues to work on discharge planning  7/19- symptoms remain the same, she looks better but states she does not feel much better; reports constipation and insomnia; started on Dulcolax, MiraLAX and  increase amitriptyline to 50 mg a day; discharge planning.  Still pending  RECOMMENDATIONS:    - Patient was informed of abnormal and incidental imaging findings during hospitalization, and advised to review this information with their  medical provider.    Procedures/Imaging:   Chest 2 Views    Result Date: 07/14/2016       NO SIGNIFICANT ABNORMALITY. [name], MD 07/14/2016 8:43 PM    Xr Elbow Left Ap Lateral And Obliques    Result Date: 07/17/2016   1. Small joint effusion otherwise negative [name], MD 07/17/2016 5:06 PM    Ct Head Wo Contrast    Result Date: 07/14/2016      NO ACUTE INTRACRANIAL ABNORMALITY. [name], MD 07/14/2016 8:47 PM    Ct Cervical Spine Wo Contrast    Result Date: 07/14/2016    SPASM. NO ACUTE FRACTURE. [name], MD 07/14/2016 8:49 PM    Mri Brain W Wo Contrast    Result Date: 07/16/2016   There are a few small nonspecific foci of hyperintensity in the white matter of the right and left cerebral hemispheres. 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 Please review above. [name], MD 07/16/2016 10:17 AM    Shoulder Right 2+ Views    Result Date: 07/14/2016   NO SIGNIFICANT ABNORMALITY. [name], MD 07/14/2016 8:33 PM     Echo Results     None               Best Practices   Was the patient admitted with either a CHF Exacerbation or Pneumonia? NO     Progress Note/Physical Exam at Discharge     Has persistent symptoms of headache, photosensitivity, difficulty sleeping, dizziness, occasional nausea    Vitals:    07/23/16 0500 07/23/16 0852 07/23/16 1338 07/23/16 1653   BP:  102/57 111/58 109/63   Pulse:  (!) 101 81 88   Resp:  18 18 18    Temp:  98.1 F (36.7 C) (!) 96.7 F (35.9 C) 97.2 F (36.2 C)   TempSrc:  Oral Oral Oral   SpO2:  98% 97% 99%   Weight: 59.5 kg (131 lb 3.2 oz)      Height:           General: NAD  HEENT: sclera anicteric, OP: Clear, MMM  Cardiovascular: RRR, no m/r/g  Lungs: CTAB, no w/r/r  Abdomen: soft, +BS, NT/ND, no  masses, no  g/r  Extremities: Warm and well perfused  Skin: no rashes or lesions noted on exposed surfaces  Neuro: Answers questions appropriately, responds to commands       Diagnostics     Labs/Studies Pending at Discharge: NO    Last Labs     Recent Labs  Lab 07/21/16  0949 07/17/16  1334   WBC 21.38* 11.50*   RBC 3.86* 4.31   Hgb 10.1* 11.3*   Hematocrit 32.0* 36.5*   MCV 82.9 84.7   Platelets 313 340         Recent Labs  Lab 07/21/16  0949 07/17/16  1334   Sodium 136 137   Potassium 4.5 4.4   Chloride 104 108   CO2 25 21*   BUN 25.0* 15.0   Creatinine 0.7 0.9   Glucose 97 104*   Calcium 9.3 9.3       Microbiology Results     None           Patient Instructions   Discharge Diet:   Discharge Activity: As tolerated  LABS/TESTING after discharge    Follow Up Appointment:  Follow-up Information     [facility]. Schedule an appointment as soon as possible for a visit in 1 week(s).    Contact information:  [address]  [address]  [place] [address]  [phone]           Vivi Martens, MD PhD. Schedule an appointment as soon as possible for a visit in 1 week(s).    Specialty:  Neurology  Contact information:  [address] [place] [address]  [address]  [place] [address]  [phone]             [name], MD .                  Time spent examining patient, discussing with patient/family regarding hospital course, chart review, reconciling medications and discharge planning: > 35 minutes.  This patient was examined by me on , the day of discharge.    Signed,  Sheria Lang Elaf Clauson    5:09 PM 07/23/2016

## 2016-07-23 NOTE — Progress Notes (Signed)
Care handed over to another nurse around 2300. Pt was resting in bed during hand off.  Pt refused heparin injection.

## 2016-07-24 MED ORDER — METHOCARBAMOL 500 MG PO TABS
500.0000 mg | ORAL_TABLET | Freq: Four times a day (QID) | ORAL | Status: DC | PRN
Start: 2016-07-24 — End: 2016-07-26

## 2016-07-24 MED ORDER — NAPROXEN 250 MG PO TABS
250.0000 mg | ORAL_TABLET | Freq: Two times a day (BID) | ORAL | Status: DC
Start: 2016-07-24 — End: 2016-07-26
  Administered 2016-07-24 – 2016-07-25 (×3): 250 mg via ORAL
  Filled 2016-07-24 (×3): qty 1

## 2016-07-24 NOTE — Progress Notes (Signed)
[facility]  PROGRESS NOTE      Patient: Wendy Buckley  Date: 07/24/2016   LOS: 7 Days  Admission Date: 07/14/2016   MRN: [id number]  Attending: Silvestre Mesi  A Wendy Buckley  Please page me at [phone]       ASSESSMENT/PLAN     Wendy Buckley is a 46 y.o. 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 Significant dizziness with confusion and memory loss -  patient still significant unsteadiness on her feet , persistent  headache, dizziness, nausea, vomiting, photosensitivity   MRI brain without acute findings  -Speech therapy/OT evaluation /Physical therapy  -Awaiting insurance approval for short-term rehabilitation  -Neurontin 800 mg 3 times a day  -Change Robaxin to when necessary  -Toradol discontinued  -Amitriptyline increased to 50 mg daily  -On Depakote 500 mg twice a day-check ammonia level  -Neurontin 900 mg 3 times a day  -Start naproxen twice a day when necessary  HTN-stable  - continue to monitor  Constipation-resolved      Nutrition: Reg    DVT Prophylaxis: SCDs, Heparin sq    Code Status: Full Code           SUBJECTIVE     She reports ongoing headache, intermittent vomiting, memory loss, dizziness, blurry vision, photosensitivity    MEDICATIONS     Current Facility-Administered Medications   Medication Dose Route Frequency   . amitriptyline  50 mg Oral QHS   . divalproex sodium  500 mg Oral Q12H SCH   . famotidine  20 mg Oral Q12H SCH   . gabapentin  900 mg Oral Q8H   . heparin (porcine)  5,000 Units Subcutaneous Q12H SCH   . lidocaine   Topical TID   . methocarbamol  500 mg Oral QID   . polyethylene glycol  17 g Oral Daily   . sodium phosphate  1 enema Rectal Once       PHYSICAL EXAM     Vitals:    07/24/16 1418   BP: 107/58   Pulse: 74   Resp:    Temp:    SpO2:        Temperature: Temp  Min: 96 F (35.6 C)  Max: 99 F (37.2 C)  Pulse: Pulse  Min: 73   Max: 94  Respiratory: Resp  Min: 18  Max: 18  Non-Invasive BP: BP  Min: 98/50  Max: 129/64  Pulse Oximetry SpO2  Min: 98 %  Max: 100 %    Intake and Output Summary (Last 24 hours) at Date Time    Intake/Output Summary (Last 24 hours) at 07/24/16 1644  Last data filed at 07/23/16 2000   Gross per 24 hour   Intake              470 ml   Output                0 ml   Net              470 ml       GEN APPEARANCE: A and Ox3, uses eyescreen  HEENT: No scleral icterus, MMM  CVS: RRR, S1, S2; No M/G/R  LUNGS: CTAB; No Wheezes; No Rhonchi: No rales  ABD: Soft; No TTP; + Normoactive BS, no rebound or guarding  EXT: WWP,   Skin exam:  No gross lesions noted on  exposed skin surfaces  MENTAL STATUS:  Answers questions appropriately, no focal deficit      LABS       Recent Labs  Lab 07/21/16  0949   WBC 21.38*   RBC 3.86*   Hgb 10.1*   Hematocrit 32.0*   MCV 82.9   Platelets 313         Recent Labs  Lab 07/21/16  0949   Sodium 136   Potassium 4.5   Chloride 104   CO2 25   BUN 25.0*   Creatinine 0.7   Glucose 97   Calcium 9.3                         Microbiology Results     None           RADIOLOGY     Xr Elbow Left Ap Lateral And Obliques    Result Date: 07/17/2016   1. Small joint effusion otherwise negative [name], MD 07/17/2016 5:06 PM        Signed,  Wendy Buckley  4:44 PM 07/24/2016

## 2016-07-24 NOTE — Progress Notes (Signed)
Nutritional Support Services  Nutrition Note    Wendy Buckley 46 y.o. female   MRN: [id number]    Nutrition Brief Note    Obtained consult from Foodservice staff regarding patient with diet questions. RDN met with pt inpt's room. Pt with questions regarding healthy diet for weight loss/muscle mass gain. RDN reviewed general, healthful nutrition with pt. Answered pt's questions. Encouraged pt to meet with outpatient dietitian.    Will follow up for full nutrition assessment per LOS protocol.    [name], RDN   Ext. [phone]

## 2016-07-24 NOTE — SLP Progress Note (Addendum)
Speech Language Pathology  [facility]  Speech Therapy Treatment Note    Patient:  Eira Alpert MRN#:  [id number]  Unit:  [facility] Room/Bed:  V7846/N6295-M    # of Visits:    Time of treatment:   Start Time: 1240                      Stop Time: 1308      Subjective: Patient was seen to address cognitive linguistic function.  She is awake and requesting to go home.  She reported nausea and a headache. Patient perseverated on [place] planning.             Objective:Cognitive-linguistic tx, development and training to utilizeappropriate compensatory memory and attentionstrategies          Assessment: Patient completed immediate recall of 4 units/words presented x2 with written and semantic cues.  She required extended time, repetition and rehearsal up to 4 minutes to increase immediate recall with no distractors from 1-2 units to 3/4 units.   When distractions were provided (noisy roommate was on the phone) patient's sustained attention was reduced but she attempted to stay on the task. Patient's recall of 3 words previously rehearsed completely diminished after 2-4 minutes to 0 recall.  Recent memory continues to be impaired and patient was not able to produce a journal to increase recall of recent events. She was not able to recall recent information for what time she woke up, who she saw in the am nor what she had for breakfast.           Plan/Recommendations: Continue w/ POC. ST intervention continues to be medically necessary upon d/c from hospital        Charges Minutes   SLP treat  28   Swallow treat        07/24/2016  Karleen Hampshire M.S., CCC-SLP

## 2016-07-24 NOTE — OT Progress Note (Signed)
Occupational Therapy Note  [facility]  Occupational Therapy Treatment     Patient: Wendy Buckley     MRN#: [id number]   Unit: [facility]  Bed: Y7829/F6213-Y    Time of treatment:  Time Calculation  OT Received On: 07/24/16  Start Time: 1357  Stop Time: 1431  Time Calculation (min): 34 min  Total Treatment Time (min): 29   Time deducted for time therapist out of room.             Chart Review and Collaboration with Care Team: 5 minutes, not included in above time.    OT Visit Number: 2    Precautions and Contraindications:    Precautions  Weight Bearing Status: no restrictions  Other Precautions: fall risk    Updated Labs:  Lab Results   Component Value Date/Time    HGB 10.1 (L) 07/21/2016 09:49 AM    HCT 32.0 (L) 07/21/2016 09:49 AM    K 4.5 07/21/2016 09:49 AM    NA 136 07/21/2016 09:49 AM    TROPI 0.01 07/14/2016 08:46 PM       All imaging reviewed, please see chart for details.    Subjective:    .  "What does my head have to do with my walking?" "My knees feel weak."   Patient Goal: To return home    Patient's medical condition is appropriate for Occupational Therapy intervention at this time.  Patient is agreeable to participation in the therapy session. Nursing clears patient for therapy. Pt requires encouragement from RN for participation.  Pain Assessment  Pain Assessment: Numeric Scale (0-10)  Pain Score: 8-severe pain  POSS Score: Awake and Alert  Pain Location: Head (right flank)  Pain Orientation: Right  Pain Descriptors: Throbbing  Pain Frequency: Continuous  Pain Intervention(s): Medication (See eMAR);Rest (RN aware)      Objective:  Observation of Patient/Vital Signs:       07/24/16 1405 07/24/16 1418   Vital Signs   Heart Rate 76 74   BP 104/68 107/58   BP Location Right arm Right arm   Patient Position Sitting Lying         Patient received in bed with bed alarm  and seizure precaution pads in place.    Cognition/Neuro Status  Arousal/Alertness: Appropriate  responses to stimuli  Attention Span: Appears intact  Orientation Level: Oriented to person;Oriented to place;Oriented to time (oriented to time with use of mobile phone - demos good recall of compensatory strategy detailed in most recent SLP note)  Memory: Decreased recall of precautions (decreased recall of home set-up when questioned)  Following Commands: Follows one step commands with repetition  Safety Awareness: moderate verbal instruction  Behavior: calm;cooperative;impulsive (cooperative with encouragement)    Functional Mobility  Supine to Sit Transfers: Independent  Sit to Supine Transfers: Independent  Sit to Stand Transfers: Supervision (from EOB with instruction for hand placement)  Stand to Sit Transfers: Supervision  Functional Mobility/Ambulation: Contact Guard Assist;Supervision (w/ FWW x6 ft; SUP w/o AD to/from bathroom. Pt tolerates ~6 ft ambulation with FWW and CGA 2/2 report of bilateral knee weakness. Following therapist exit from room, RN reporting found pt OOB>bathroom (bed alarm sounding) without AD and without SUP or A. Therapist returning to room to pt completing toileting and sink side grooming tasks, ambulating back to bed w/o AD)    Self-care and Home Management  Grooming: Supervision;standing at sink (distant SUP)  UB Dressing: Stand by Assist;at edge of bed  LB Dressing:  Supervision;edge of bed  Toileting: Supervision (distant SUP)  Functional Transfers: Supervision;toilet transfer (distant SUP (sit>stand))       Therapeutic Exercise:  Edu on benefits of BLE AP, marches, SLR. Pt verbalizes understanding, but does not demo this session.                              Educated the patient to role of occupational therapy, plan of care, goals of therapy and HEP, safety with mobility and ADLs, discharge instructions.     Patient left in bed with bed alarm and seizure precautions pads in place and call bell and all personal items/needs within reach. RN notified of session outcome.        Assessment:  Patient presents inconsistently with performance of functional mobility as pt requiring increased assistance with therapist presence. Pt continues to require education on safety in ADL and functional mobility and repetition of all education provided this session for carryover. Recommend change in D/C rec to home with 24/7 SUP and HH OT. Pt verbalizes understanding and agreement.    PMP Activity: Step 6 - Walks in Room  Distance Walked (ft) (Step 6,7): 15 Feet      Plan:  Goal Formulation: Patient  OT Plan  Risks/Benefits/POC Discussed with Pt/Family: With patient  Treatment Interventions: ADL retraining, Functional transfer training, Endurance training, Cognitive reorientation, Patient/Family training, Equipment eval/education, Neuro muscular reeducation  Discharge Recommendation: Home with supervision, Home with home health OT, Other (Comment) (If 24/7 SUP unavailable, then SNF)  DME Recommended for Discharge: Shower chair  OT Frequency Recommended: 1-2x/wk  OT - Next Visit Recommendation: 07/28/16    Time For Goal Achievement: by time of discharge  ADL Goals  Patient will groom self: Supervision, Goal met  Patient will dress lower body: Stand by Assist, Goal met  Patient will toilet: Stand by Assist, Goal met  Mobility and Transfer Goals  Pt will transfer bed to toilet: Stand by Assist, Goal met, New goal, Independent        Executive Fucntion Goals  Pt will demonstrate safety: with supervision, to increase ability to complete ADLs, Not met  Pt will demonstrate attention to task: with supervision, to increase ability to complete ADLs, Goal met              Adapt plan with change in D/C rec to home with 24/7 SUP and HH OT.       DME Recommended for Discharge: Shower chair  Discharge Recommendation: Home with supervision, Home with home health OT, Other (Comment) (If 24/7 SUP unavailable, then SNF)      Olen Pel, MSOT, OTR/L [phone]  07/24/2016 2:49 PM

## 2016-07-24 NOTE — Progress Notes (Signed)
07/24/2016  Worker's comp still reviewing case so no auth received for [facility]  DCP: to acute rehab when auth received.  Van Clines RN, [place]  Case Management  [phone]

## 2016-07-24 NOTE — Plan of Care (Signed)
Problem: Occupational Therapy  Goal: By discharge, patient will perform self-care at the patient's highest functional potential.  See OT evaluation/note for goals.  Outcome: Progressing  Discharge Recommendation: Home with supervision, Home with home health OT, Other (Comment) (If 24/7 SUP unavailable, then SNF)   DME Recommended for Discharge: Shower chair    OT Frequency Recommended: 1-2x/wk     Is PT evaluation indicated at this time? This patient is already on PT caseload.     PMP Activity: Step 6 - Walks in Room  Distance Walked (ft) (Step 6,7): 15 Feet  (Please See Therapy Evaluation for device and assistance level needed)    Treatment Interventions: ADL retraining, Functional transfer training, Endurance training, Cognitive reorientation, Patient/Family training, Equipment eval/education, Neuro muscular reeducation       Goal Formulation: Patient  Time For Goal Achievement: by time of discharge  ADL Goals  Patient will groom self: Supervision, Goal met  Patient will dress lower body: Stand by Assist, Goal met  Patient will toilet: Stand by Assist, Goal met  Mobility and Transfer Goals  Pt will transfer bed to toilet: Stand by Assist, Goal met, New goal, Independent        Executive Fucntion Goals  Pt will demonstrate safety: with supervision, to increase ability to complete ADLs, Not met  Pt will demonstrate attention to task: with supervision, to increase ability to complete ADLs, Goal met

## 2016-07-24 NOTE — Plan of Care (Addendum)
Problem: Safety  Goal: Patient will be free from injury during hospitalization  Outcome: Progressing   07/24/16 0539   Goal/Interventions addressed this shift   Patient will be free from injury during hospitalization  Assess patient's risk for falls and implement fall prevention plan of care per policy;Provide and maintain safe environment;Use appropriate transfer methods;Ensure appropriate safety devices are available at the bedside;Include patient/ family/ care giver in decisions related to safety;Hourly rounding   Safety measures in place. Able to utilize the call bell for assistance. Individualized plan of care explained to the patient, verbalizes understanding and agrees to it. Hourly rounding continue.    Problem: Altered GI Function  Goal: Elimination patterns are normal or improving  Outcome: Progressing   07/24/16 0539   Goal/Interventions addressed this shift   Elimination patterns are normal or improving Report abnormal assessment to physician;Anticipate/assist with toileting needs;Assess for normal bowel sounds;Monitor for abdominal discomfort;Monitor for abdominal distension;Administer treatments as ordered;Consult/collaborate with Clinical Nutritionist;Assess for flatus;Reinforce education on foods that improve and complicate bowel movements and how activity and medications can affect bowel movements;Administer medications to improve bowel evacuation as prescribed;Encourage /perform oral hygiene as appropriate   Pt C/O heart burn, MD paged and maalox oral suspension given as ordered with some effect. Pt had bowel movement after the intake of prune juice and slept well.

## 2016-07-24 NOTE — Plan of Care (Signed)
Problem: Safety  Goal: Patient will be free from injury during hospitalization  Outcome: Progressing  The learning abilities of the patient has been assessed. Today's individualized plan of care includes adhering to the fall prevention plan and precautions. The patient agrees to the plan of care and verbalized understanding of the disease process, treatment plan, medications, and consequences of noncompliance. All questions and concerns were addressed. Pt verbalized understanding of using the call light to notify staff when needing assistance, wearing the nonskid footwear, having the bed alarm activated, having the floor mat at the bedside and wearing fall alert armband. Seizure precautions in place. Pt is hourly rounded on and need are addressed. Standby assist when OOB.        07/24/16 1600   Goal/Interventions addressed this shift   Patient will be free from injury during hospitalization  Include patient/ family/ care giver in decisions related to safety;Hourly rounding;Assess patient's risk for falls and implement fall prevention plan of care per policy;Provide and maintain safe environment       Problem: Compromised Hemodynamic Status  Goal: Vital signs and fluid balance maintained/improved  Outcome: Progressing  The learning abilities of the patient has been assessed. Today's individualized plan of care includes monitoring VS and fluid balance. The patient agrees to the plan of care and verbalize understanding of the disease process, treatment plan, medications, and consequences of noncompliance. All questions and concerns were addressed. VSS on RA. Intake and output noted.        07/24/16 1600   Goal/Interventions addressed this shift   Vital signs and fluid balance are maintained/improved Position patient for maximum circulation/cardiac output;Monitor/assess vitals and hemodynamic parameters with position changes;Monitor and compare daily weight;Monitor intake and output. Notify LIP if urine output is less  than 30 mL/hour.;Monitor/assess lab values and report abnormal values       Problem: Impaired Mobility  Goal: Mobility/Activity is maintained at optimal level for patient  Outcome: Progressing  Pt is standby assist and reporting dizziness and weakness at times. Pt waiting for approval for acute rehab.      07/24/16 1600   Goal/Interventions addressed this shift   Mobility/activity is maintained at optimal level for patient Increase mobility as tolerated/progressive mobility;Maintain proper body alignment;Perform active/passive ROM;Plan activities to conserve energy, plan rest periods;Reposition patient every 2 hours and as needed unless able to reposition self;Consult/collaborate with Physical Therapy and/or Occupational Therapy

## 2016-07-25 ENCOUNTER — Inpatient Hospital Stay: Payer: Worker's Comp, Other unspecified

## 2016-07-25 DIAGNOSIS — M542 Cervicalgia: Secondary | ICD-10-CM

## 2016-07-25 DIAGNOSIS — R4182 Altered mental status, unspecified: Secondary | ICD-10-CM

## 2016-07-25 LAB — CBC
Absolute NRBC: 0 10*3/uL
Hematocrit: 33.5 % — ABNORMAL LOW (ref 37.0–47.0)
Hgb: 10.3 g/dL — ABNORMAL LOW (ref 12.0–16.0)
MCH: 26.1 pg — ABNORMAL LOW (ref 28.0–32.0)
MCHC: 30.7 g/dL — ABNORMAL LOW (ref 32.0–36.0)
MCV: 84.8 fL (ref 80.0–100.0)
MPV: 9.8 fL (ref 9.4–12.3)
Nucleated RBC: 0 /100 WBC (ref 0.0–1.0)
Platelets: 291 10*3/uL (ref 140–400)
RBC: 3.95 10*6/uL — ABNORMAL LOW (ref 4.20–5.40)
RDW: 16 % — ABNORMAL HIGH (ref 12–15)
WBC: 10.29 10*3/uL (ref 3.50–10.80)

## 2016-07-25 LAB — GFR: EGFR: >60

## 2016-07-25 LAB — AMMONIA: Ammonia: 30 umol/L (ref 18–72)

## 2016-07-25 LAB — COMPREHENSIVE METABOLIC PANEL
ALT: 18 U/L (ref 0–55)
AST (SGOT): 24 U/L (ref 5–34)
Albumin/Globulin Ratio: 1.2 (ref 0.9–2.2)
Albumin: 3.3 g/dL — ABNORMAL LOW (ref 3.5–5.0)
Alkaline Phosphatase: 50 U/L (ref 37–106)
Anion Gap: 7 (ref 5.0–15.0)
BUN: 15 mg/dL (ref 7.0–19.0)
Bilirubin, Total: 0.1 mg/dL — ABNORMAL LOW (ref 0.2–1.2)
CO2: 28 mEq/L (ref 22–29)
Calcium: 8.9 mg/dL (ref 8.5–10.5)
Chloride: 100 mEq/L (ref 100–111)
Creatinine: 0.8 mg/dL (ref 0.6–1.0)
Globulin: 2.7 g/dL (ref 2.0–3.6)
Glucose: 93 mg/dL (ref 70–100)
Potassium: 4.8 mEq/L (ref 3.5–5.1)
Protein, Total: 6 g/dL (ref 6.0–8.3)
Sodium: 135 mEq/L — ABNORMAL LOW (ref 136–145)

## 2016-07-25 LAB — HEMOLYSIS INDEX: Hemolysis Index: 22 — ABNORMAL HIGH (ref 0–18)

## 2016-07-25 LAB — URINE HCG QUALITATIVE: Urine HCG Qualitative: NEGATIVE

## 2016-07-25 LAB — C-REACTIVE PROTEIN: C-Reactive Protein: 0.9 mg/dL — ABNORMAL HIGH (ref 0.0–0.8)

## 2016-07-25 MED ORDER — DIPHENHYDRAMINE HCL 25 MG PO CAPS
25.0000 mg | ORAL_CAPSULE | Freq: Four times a day (QID) | ORAL | Status: DC | PRN
Start: 2016-07-25 — End: 2016-07-28
  Administered 2016-07-25 – 2016-07-28 (×5): 25 mg via ORAL
  Filled 2016-07-25 (×5): qty 1

## 2016-07-25 MED ORDER — NYSTATIN 100000 UNIT/GM EX POWD
Freq: Two times a day (BID) | CUTANEOUS | Status: DC
Start: 2016-07-25 — End: 2016-07-28
  Filled 2016-07-25: qty 15

## 2016-07-25 MED ORDER — IOHEXOL 240 MG/ML IJ SOLN
50.0000 mL | Freq: Once | INTRAMUSCULAR | Status: DC | PRN
Start: 2016-07-25 — End: 2016-07-28

## 2016-07-25 NOTE — Plan of Care (Signed)
Problem: Neurological Deficit  Goal: Neurological status is stable or improving  Outcome: Progressing   07/25/16 1451   Goal/Interventions addressed this shift   Neurological status is stable or improving Monitor/assess/document neurological assessment (Stroke: every 4 hours)       Problem: Anxiety  Goal: Anxiety is at a manageable level  Outcome: Progressing   07/25/16 1451   Goal/Interventions addressed this shift   Anxiety is at a manageable level Inform/explain to patient/patient care companion all tests/procedures/treatment/care prior to initiation;Facilitate expression of feelings, fears, concerns, anxiety;Assess emotional status and coping mechanisms;Provide and maintain a safe environment;Include patient/patient care companion in decisions related to anxiety/depression;Provide emotional support;Assist in developing anxiety-reducing skills;Encourage participation in care       Comments: Ambulatory, gait steady, short term memory loss (i.e. Took AM medications, 30 min later asked "When am I going to get my medications"), states had mult BMs so refused miralax and colace, refused heparin SQ, benadryl given for c/o itching, nystatin powder for c/o "rash and itching" in groin "because I have been pooping so much", states did not sleep last night, will alert oncoming shift to give pt prn sleeping pill as she has requested    SR x3, seizure precautions in use, NAD noted, purposely hourly rounding done

## 2016-07-25 NOTE — Progress Notes (Signed)
[facility]  PROGRESS NOTE      Patient: Wendy Buckley  Date: 07/25/2016   LOS: 8 Days  Admission Date: 07/14/2016   MRN: [id number]  Attending: Carmelina Paddock  Please page me at [phone]       ASSESSMENT/PLAN     Wendy Buckley is a 46 y.o. 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 Significant dizziness with confusion and memory loss -  patient still significant unsteadiness on her feet , persistent  headache, dizziness, nausea, vomiting, photosensitivity   MRI brain without acute findings  -Speech therapy/OT evaluation /Physical therapy  -Awaiting insurance approval for short-term rehabilitation  -Neurontin 800 mg 3 times a day  -Change Robaxin to when necessary  -Toradol discontinued  -Amitriptyline increased to 50 mg daily  -On Depakote 500 mg twice a day-check ammonia level  -Neurontin 900 mg 3 times a day  -Start naproxen twice a day when necessary  HTN-stable  - continue to monitor  Constipation-resolved     Left groin pain  Hx of hernia repair. Pain when she bears down. CT pelvis to assess for inguinal hernia or pelvic fracture      Nutrition: Reg    DVT Prophylaxis: SCDs, Heparin sq    Code Status: Full Code           SUBJECTIVE     She reports ongoing headache, memory loss, dizziness, blurry vision, photosensitivity  She has left inguinal pain    MEDICATIONS     Current Facility-Administered Medications   Medication Dose Route Frequency   . amitriptyline  50 mg Oral QHS   . divalproex sodium  500 mg Oral Q12H SCH   . famotidine  20 mg Oral Q12H SCH   . gabapentin  900 mg Oral Q8H   . heparin (porcine)  5,000 Units Subcutaneous Q12H SCH   . lidocaine   Topical TID   . naproxen  250 mg Oral BID Meals   . nystatin   Topical BID   . polyethylene glycol  17 g Oral Daily   . sodium phosphate  1 enema Rectal Once       PHYSICAL EXAM     Vitals:     07/25/16 1530   BP: 99/57   Pulse: 86   Resp: 18   Temp: 97.6 F (36.4 C)   SpO2:        Temperature: Temp  Min: 97 F (36.1 C)  Max: 97.6 F (36.4 C)  Pulse: Pulse  Min: 69  Max: 86  Respiratory: Resp  Min: 17  Max: 18  Non-Invasive BP: BP  Min: 91/60  Max: 108/66  Pulse Oximetry SpO2  Min: 96 %  Max: 99 %    Intake and Output Summary (Last 24 hours) at Date Time    Intake/Output Summary (Last 24 hours) at 07/25/16 1805  Last data filed at 07/25/16 1200   Gross per 24 hour   Intake              552 ml   Output                0 ml   Net              552 ml       GEN APPEARANCE: A and Ox3, uses eyescreen  HEENT: No scleral icterus, MMM  CVS: RRR, S1, S2; No M/G/R  LUNGS: CTAB; No Wheezes; No Rhonchi: No rales  ABD: Soft; No TTP; + Normoactive BS, no rebound or guarding  Tender left groin  EXT: WWP,   Skin exam:  No gross lesions noted on exposed skin surfaces  MENTAL STATUS:  Answers questions appropriately, no focal deficit      LABS       Recent Labs  Lab 07/25/16  0850 07/21/16  0949   WBC 10.29 21.38*   RBC 3.95* 3.86*   Hgb 10.3* 10.1*   Hematocrit 33.5* 32.0*   MCV 84.8 82.9   Platelets 291 313         Recent Labs  Lab 07/25/16  0850 07/21/16  0949   Sodium 135* 136   Potassium 4.8 4.5   Chloride 100 104   CO2 28 25   BUN 15.0 25.0*   Creatinine 0.8 0.7   Glucose 93 97   Calcium 8.9 9.3         Recent Labs  Lab 07/25/16  0850   ALT 18   AST (SGOT) 24   Bilirubin, Total 0.1*   Albumin 3.3*   Alkaline Phosphatase 50                   Microbiology Results     None           RADIOLOGY     reviewed      Darlin Priestly  6:05 PM 07/25/2016

## 2016-07-25 NOTE — Plan of Care (Signed)
Problem: Safety  Goal: Patient will be free from injury during hospitalization  Outcome: Progressing   07/25/16 0033   Goal/Interventions addressed this shift   Patient will be free from injury during hospitalization  Assess patient's risk for falls and implement fall prevention plan of care per policy;Provide and maintain safe environment;Use appropriate transfer methods;Ensure appropriate safety devices are available at the bedside;Include patient/ family/ care giver in decisions related to safety;Hourly rounding   Safety measures ensured by turning the bed alarm on, personal items and call bell in reach, bed in lowest position. Hourly rounding continue. Continue to monitor.     Problem: Compromised Hemodynamic Status  Goal: Vital signs and fluid balance maintained/improved  Outcome: Progressing   07/25/16 0033   Goal/Interventions addressed this shift   Vital signs and fluid balance are maintained/improved Position patient for maximum circulation/cardiac output;Monitor/assess vitals and hemodynamic parameters with position changes;Monitor and compare daily weight;Monitor intake and output. Notify LIP if urine output is less than 30 mL/hour.;Monitor/assess lab values and report abnormal values   VSS. Denies pain and SOB. Saturation 97% at room air. I/O monitored. Learning needs of the patient assessed and individualized plan of care , treatment and management discussed with the patient, verbalizes understanding and aggress to it. All the needs of the patient addressed.Will continue to monitor.

## 2016-07-26 MED ORDER — DIVALPROEX SODIUM 125 MG PO CSDR
250.0000 mg | DELAYED_RELEASE_CAPSULE | Freq: Two times a day (BID) | ORAL | Status: DC
Start: 2016-07-26 — End: 2016-07-28
  Administered 2016-07-26 – 2016-07-27 (×3): 250 mg via ORAL
  Filled 2016-07-26 (×5): qty 2

## 2016-07-26 MED ORDER — VITAMINS/MINERALS PO TABS
1.0000 | ORAL_TABLET | Freq: Every day | ORAL | Status: DC
Start: 2016-07-26 — End: 2016-07-28
  Administered 2016-07-26 – 2016-07-28 (×3): 1 via ORAL
  Filled 2016-07-26 (×3): qty 1

## 2016-07-26 MED ORDER — GABAPENTIN 400 MG PO CAPS
400.0000 mg | ORAL_CAPSULE | Freq: Three times a day (TID) | ORAL | Status: DC
Start: 2016-07-26 — End: 2016-07-28
  Administered 2016-07-26 – 2016-07-28 (×5): 400 mg via ORAL
  Filled 2016-07-26 (×5): qty 1

## 2016-07-26 MED ORDER — ENOXAPARIN SODIUM 40 MG/0.4ML SC SOLN
40.0000 mg | Freq: Every day | SUBCUTANEOUS | Status: DC
Start: 2016-07-27 — End: 2016-07-28
  Filled 2016-07-26 (×2): qty 0.4

## 2016-07-26 MED ORDER — IBUPROFEN 400 MG PO TABS
800.0000 mg | ORAL_TABLET | Freq: Three times a day (TID) | ORAL | Status: DC | PRN
Start: 2016-07-26 — End: 2016-07-28
  Administered 2016-07-26 – 2016-07-28 (×3): 800 mg via ORAL
  Filled 2016-07-26 (×3): qty 2

## 2016-07-26 NOTE — Progress Notes (Signed)
Telephone order from Dr. Abran Richard for vit D, vit B, Lyme titer to be drawn tomorrow am. Reporting to MD that pt c/o burning to rectum. Pt c/o having diarrhea a couple days ago and this started after. Pt applied barrier cream to bottom. Unable to see redness at this time. Pt reporting she does not want to wipe barrier cream off as this will hurt her bottom. Per MD, wait until cream comes off to reassess.

## 2016-07-26 NOTE — Progress Notes (Signed)
S[facility] HOSPITAL[facility]  PROGRESS NOTE      Patient: Wendy Buckley  Date: 07/26/2016   LOS: 9 Days  Admission Date: 07/14/2016   MRN: [id number]  Attending: Carmelina Paddock  Please page me at [phone]       ASSESSMENT/PLAN     Wendy Buckley is a 46 y.o. 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 Significant dizziness with confusion and memory loss -  patient still significant unsteadiness on her feet , persistent  headache, dizziness, nausea, vomiting, photosensitivity  She exhibits aggressive behavior today.    MRI brain without acute findings. There are a few small foci of  hyperintensity identified on long TR images in the right frontal lobe  white matter and the left parietal lobe white matter. Differential  diagnosis includes minimal microvascular ischemic changes. Additional  considerations in the differential diagnosis include demyelinating  disease, vasculitis, and infection such as Lyme disease.  Will send lyme titers   -Speech therapy/OT evaluation /Physical therapy. Recommend acute rehab. Pending insurance authorization  -taper Neurontin 800 to 400  mg 3 times a day  -d/c Robaxin   -Toradol discontinued  -Amitriptyline increased to 50 mg daily  -On Depakote 500 mg twice a day. Taper down to 250 mg bid    HTN-stable  - continue to monitor  Constipation-resolved     Left groin pain  Hx of hernia repair. Pain when she bears down. CT pelvis negative for  inguinal hernia or pelvic fracture      Nutrition: Reg    DVT Prophylaxis: SCDs, lovenox sq    Code Status: Full Code           SUBJECTIVE     She reports ongoing headache, memory loss, dizziness, blurry vision, photosensitivity  She has left inguinal pain    MEDICATIONS     Current Facility-Administered Medications   Medication Dose Route Frequency   . amitriptyline  50 mg Oral QHS   . divalproex sodium   250 mg Oral Q12H SCH   . [START ON 07/27/2016] enoxaparin  40 mg Subcutaneous Daily   . famotidine  20 mg Oral Q12H SCH   . gabapentin  400 mg Oral Q8H SCH   . lidocaine   Topical TID   . nystatin   Topical BID   . polyethylene glycol  17 g Oral Daily   . sodium phosphate  1 enema Rectal Once       PHYSICAL EXAM     Vitals:    07/26/16 1139   BP: 114/76   Pulse: 82   Resp:    Temp: 97 F (36.1 C)   SpO2: 100%       Temperature: Temp  Min: 97 F (36.1 C)  Max: 98.4 F (36.9 C)  Pulse: Pulse  Min: 76  Max: 86  Respiratory: Resp  Min: 18  Max: 19  Non-Invasive BP: BP  Min: 99/57  Max: 115/67  Pulse Oximetry SpO2  Min: 96 %  Max: 100 %    Intake and Output Summary (Last 24 hours) at Date Time    Intake/Output Summary (Last 24 hours) at 07/26/16 1502  Last data filed at 07/25/16 2200   Gross per 24 hour   Intake              800 ml   Output  400 ml   Net              400 ml       GEN APPEARANCE: A and Ox3, uses eyescreen  HEENT: No scleral icterus, MMM  CVS: RRR, S1, S2; No M/G/R  LUNGS: CTAB; No Wheezes; No Rhonchi: No rales  ABD: Soft; No TTP; + Normoactive BS, no rebound or guarding  Tender left groin  EXT: WWP,   Skin exam:  No gross lesions noted on exposed skin surfaces  MENTAL STATUS:  Answers questions appropriately, no focal deficit      LABS       Recent Labs  Lab 07/25/16  0850 07/21/16  0949   WBC 10.29 21.38*   RBC 3.95* 3.86*   Hgb 10.3* 10.1*   Hematocrit 33.5* 32.0*   MCV 84.8 82.9   Platelets 291 313         Recent Labs  Lab 07/25/16  0850 07/21/16  0949   Sodium 135* 136   Potassium 4.8 4.5   Chloride 100 104   CO2 28 25   BUN 15.0 25.0*   Creatinine 0.8 0.7   Glucose 93 97   Calcium 8.9 9.3         Recent Labs  Lab 07/25/16  0850   ALT 18   AST (SGOT) 24   Bilirubin, Total 0.1*   Albumin 3.3*   Alkaline Phosphatase 50                   Microbiology Results     None           RADIOLOGY     reviewed      Signed,  Carmelina Paddock  3:02 PM 07/26/2016

## 2016-07-26 NOTE — Plan of Care (Addendum)
Problem: Physical Therapy  Goal: By discharge, patient will perform mobility at the patient's highest functional potential. See PT evaluation/note for goals.  Outcome: Progressing  Discharge Recommendation: Home with supervision, Home with home health PT (24 hr supervision )  DME Recommended for Discharge: Grab bars (LRAD to be determined)    Is an Occupational Therapy Evaluation Indicated at this time? This patient is already on OT caseload.     Treatment/Interventions: Exercise, Gait training, LE strengthening/ROM, Cognitive reorientation, Patient/family training, Neuromuscular re-education, Equipment eval/education, Continued evaluation, Stair training  PT Frequency: 3-4x/wk     PMP Activity: Step 7 - Walks out of Room  Distance Walked (ft) (Step 6,7): 400 Feet  (Please See Therapy Evaluation for device and assistance level needed)    Goals:   Goals  Goal Formulation: With patient  Time for Goal Acheivement: By time of discharge  Goals: Select goal  Pt Will Perform Sit to Stand: independent, to maximize functional mobility and independence, Partly met  Pt Will Stand on One Foot: For 10 sec, independent, by time of discharge, New goal (bilat)  Pt Will Transfer Bed/Chair: independent, to maximize functional mobility and independence, Partly met  Pt Will Ambulate: 151-200 feet, independent, to maximize functional mobility and independence, Partly met  Pt Will Go Up / Down Stairs: 1 flight, with supervision, With rail, by time of discharge, New goal

## 2016-07-26 NOTE — Progress Notes (Addendum)
Entered room to round on patient. Pt was found standing on other side of room with her roommate. Found that this pt had ripped out sheets from her welcome packet and was taping them over the vent in the ceiling near the window. Pt reporting that she was hot and her roommate cold, pt stating that her roommate did not want the cold air from the vent blowing on her. This pt had pulled a chair over to the window to stand on to tape the paper to the vent. When I went in to see pt, pt was standing on the floor. Pt was admitted after a fall. I asked pt to not tape the papers to the vent as is a fire hazard. I then asked pt not to stand on the chair because I did not want her to fall. Pt then becoming mad and told me to leave room and come back in 30 minutes. I left room at this time as pt was becoming mad, and notified the charge nurse at this time. I entered the room with the charge nurse right after, pt had hung up more papers. Charge nurse again told pt this was a fire hazard and the papers cannot be hung in this way from ceiling and that we did not want her to stand on the chair and fall. Pt began cursing at myself and charge nurse. Offered roommate extra blankets. Charge nurse removed paper from ceiliing. Pt returning back to bed. Will continue to monitor for pt safety.

## 2016-07-26 NOTE — PT Progress Note (Signed)
[facility]  Physical Therapy Treatment    Patient:  Wendy Buckley  MRN#:  [id number]  Unit:  [facility]  Room/Bed:  V4098/J1914-N    Time of treatment:  Time Calculation  PT Received On: 07/26/16  Start Time: 1552  Stop Time: 1618  Time Calculation (min): 26 min    1 TE, 1 TA          Chart Review and Collaboration with Care Team: 8 minutes, not included in above time.    PT Visit Number: 4    Precautions:   Precautions  Weight Bearing Status: no restrictions  Other Precautions:  (Fall risk)    Updated X-Rays/Tests/Labs:  Lab Results   Component Value Date/Time    HGB 10.3 (L) 07/25/2016 08:50 AM    HCT 33.5 (L) 07/25/2016 08:50 AM    K 4.8 07/25/2016 08:50 AM    NA 135 (L) 07/25/2016 08:50 AM    TROPI 0.01 07/14/2016 08:46 PM       All imaging reviewed, please see chart for details.    CT Pelvis 7/21:  1. No evidence of inguinal lymphadenopathy, collection, mass or  recurrent hernia.  2. Myomatous uterus.      L elbow 7/13:  1. Small joint effusion otherwise negative    Subjective:  "If I work with you, can I go home?"   "I can go home now?"  "But I bring that chair over there. I can go home now?"    Patient Goal: I want to go home. Will you send me home?    Pain Assessment  Pain Assessment: Numeric Scale (0-10)  Pain Score: 8-severe pain (Pt rates as 7.5/10 headache - superior)  Pain Location: Head  Pain Orientation: Upper  Pain Descriptors: Headache (also c/o R neck soreness and L elbow discomfort)  Pain Frequency: Constant/continuous  Pain Intervention(s): Medication (See eMAR);Emotional support           Patient's medical condition is appropriate for Physical Therapy intervention at this time.  Patient is agreeable to participation in the therapy session. Nursing clears patient for therapy.      Objective:  Observation of Patient/Vital Signs:  106/65, HR 80    Patient received in bed with telemetry, intravenous (IV) line, bed alarm and sleeping eye mask on  forehead.    Cognition/Neuro Status  Arousal/Alertness: Appropriate responses to stimuli  Attention Span: Attends to task with redirection  Orientation Level: Oriented to person;Oriented to place  Safety Awareness: minimal verbal instruction  Insights: Decreased awareness of deficits;Educated in safety awareness  Problem Solving: Assistance required to identify errors made;Assistance required to generate solutions;supervision  Comments: c/o sensitivity to light; also with pain L elbow and R neck (using lidocaine cream)   Behavior: cooperative;distractable;impulsive  Motor Planning: decreased processing speed (increased time required for responses)  Coordination:  (observed to independently manipulate items on tray)  Hand Dominance: left handed (pt requiring inc time to identify this)    Musculoskeletal Examination  Gross ROM  Right Upper Extremity ROM: within functional limits (Pt initially self limiting RUE ROM, but able with AAROM)  Left Upper Extremity ROM: within functional limits  Right Lower Extremity ROM: within functional limits  Left Lower Extremity ROM: within functional limits  Gross Strength  Right Upper Extremity Strength: 3+/5 (Inconsistent grading w/MMT; poor effort)  Right Lower Extremity Strength:  (empty LAQ, though able to perform B tap ups x 5 reps w/o UE )  Left Lower Extremity Strength:  (as  with RLE)            Functional Mobility  Supine to Sit: Independent;to Left;to Right  Scooting to EOB: Independent  Sit to Supine: Independent  Sit to Stand: Supervision  Stand to Sit: Supervision     Locomotion  Ambulation: Stand by Assist (no AD, gait belt for safety)  Pattern: L flexed knee;R flexed knee;Step through;decreased cadence;decreased step length;Narrow BOS (no overt LOB)  Stair Management: Contact Guard Assist;Stand by Assist;one rail R;alternating pattern (slow, deliberate terminal ext with each step up)  Number of Stairs: 8  Distance Walked (ft) (Step 6,7): 400 Feet    Therapeutic  Exercise  Hip Flexion: standing tap ups and step ups on stair step; 2 sets of 5 reps each, bilat w/o UE support. CGA at gait belt  Strengthening: patient education     Neuro Re-Ed  Standing Balance: with instruction;with support;without support;stand by assist;contact guard assist;standing with perturbations all planes;standing reaching activities;standing weight shifting all planes;dynamic gait training           Educated the patient to role of physical therapy, plan of care, goals of therapy and safety with mobility and ADLs, home safety.    Patient left seated EOB with bed alarm activated and lunch tray in place. Call bell and all personal items/needs within reach. RN notified of session outcome.       Assessment: Pt progressing well with overall functional mobility and gait since last PT session 7/12; demonstrating increased endurance with less assistance however continues to present with decreased STM and decreased processing speed. Pt repetitive and childlike in behavior at times, requiring repeated cueing for mobility (i.e. directions with gait) and pt ed material reviewed within session. D/w pt compensatory strategies of using memory book, however pt distractable and perseverative (wanting to go home). Muscle testing on EOB inconsistent as pt as pt unable to hold to resistance with R LAQ on EOB, but later able to perform unaided tap ups and step ups x 5 reps bilat with Superv only. RN observed pt earlier today standing on chair within room. Would benefit from continued skilled PT to increase strength, balance, functional mobility and gait to maximize functional independence. Agree with OT rec of home with 24hr assist upon d/c and HHPT/OT.     PMP Activity: Step 7 - Walks out of Room  Distance Walked (ft) (Step 6,7): 400 Feet      Plan:  Treatment/Interventions: Exercise, Gait training, LE strengthening/ROM, Cognitive reorientation, Patient/family training, Neuromuscular re-education, Equipment  eval/education, Continued evaluation, Stair training      PT Frequency: 3-4x/wk   Continue plan of care. Recommend     Goals:  Goals  Goal Formulation: With patient  Time for Goal Acheivement: By time of discharge  Goals: Select goal  Pt Will Perform Sit to Stand: independent, to maximize functional mobility and independence, Partly met  Pt Will Stand on One Foot: For 10 sec, independent, by time of discharge, New goal (bilat)  Pt Will Transfer Bed/Chair: independent, to maximize functional mobility and independence, Partly met  Pt Will Ambulate: 151-200 feet, independent, to maximize functional mobility and independence, Partly met  Pt Will Go Up / Down Stairs: 1 flight, with supervision, With rail, by time of discharge, New goal        DME Recommended for Discharge: Grab bars (LRAD to be determined)  Discharge Recommendation: Home with supervision, Home with home health PT (24 hr supervision )    Roxy Cedar, [place]  [id number]  07/26/2016  6:56 PM

## 2016-07-26 NOTE — Progress Notes (Signed)
Dr. Milagros Evener notified that pt refusing Sub Q heparin this am.

## 2016-07-26 NOTE — Plan of Care (Signed)
Problem: Safety  Goal: Patient will be free from injury during hospitalization  Outcome: Progressing   07/26/16 2054   Goal/Interventions addressed this shift   Patient will be free from injury during hospitalization  Assess patient's risk for falls and implement fall prevention plan of care per policy;Provide and maintain safe environment;Hourly rounding;Include patient/ family/ care giver in decisions related to safety       Problem: Pain  Goal: Pain at adequate level as identified by patient  Outcome: Progressing   07/26/16 2054   Goal/Interventions addressed this shift   Pain at adequate level as identified by patient Identify patient comfort function goal;Reassess pain within 30-60 minutes of any procedure/intervention, per Pain Assessment, Intervention, Reassessment (AIR) Cycle;Evaluate if patient comfort function goal is met;Evaluate patient's satisfaction with pain management progress       Problem: Neurological Deficit  Goal: Neurological status is stable or improving  Outcome: Progressing   07/26/16 2054   Goal/Interventions addressed this shift   Neurological status is stable or improving Monitor/assess/document neurological assessment (Stroke: every 4 hours);Monitor/assess NIH Stroke Scale;Re-assess NIH Stroke Scale for any change in status;Perform CAM Assessment       Problem: Altered GI Function  Goal: Elimination patterns are normal or improving  Outcome: Progressing   07/26/16 2000   Goal/Interventions addressed this shift   Elimination patterns are normal or improving Report abnormal assessment to physician;Assess for signs and symptoms of bleeding. Report signs of bleeding to physician       Comments:    Condition stable VSS.receiving prn pain meds as needed..Willcontinue to provide safe environment for Pt.

## 2016-07-26 NOTE — Plan of Care (Signed)
Problem: Compromised Hemodynamic Status  Goal: Vital signs and fluid balance maintained/improved  Outcome: Progressing   07/26/16 0016   Goal/Interventions addressed this shift   Vital signs and fluid balance are maintained/improved Position patient for maximum circulation/cardiac output;Monitor/assess vitals and hemodynamic parameters with position changes;Monitor and compare daily weight;Monitor intake and output. Notify LIP if urine output is less than 30 mL/hour.;Monitor/assess lab values and report abnormal values   VSS. Denies pain and distress. I/O , labs are monitored. CT pelvis done, report awaited. Plan of care explained to the patient and verbalizes understanding. All the needs of the patient addressed. Ambulated in the unit hallway. Hourly rounding continue. Will continue to monitor.

## 2016-07-26 NOTE — Plan of Care (Signed)
Problem: Moderate/High Fall Risk Score >5  Goal: Patient will remain free of falls  Outcome: Progressing   07/26/16 0832   OTHER   High (Greater than 13) HIGH-(VH Only) Consider Safety CNA;HIGH-(VH Only) Plan with CNA for priority hourly rounding;HIGH-(VH Only) Keep door open for better visability;HIGH-(VH Only) Place "Reset Bed Alarm" sign above bed if in use;HIGH-(VH Only) Yellow slippers;HIGH-(VH Only) Place red star on assignment board;HIGH-(VH Only) Place red fall prevention sign on door/chart;HIGH-Consider use of low bed;HIGH-Initiate use of floor mats as appropriate;HIGH-Pharmacy to initiate evaluation and intervention per protocol;HIGH-Apply yellow "Fall Risk" arm band;HIGH-Activate bed/chair exit alarm where available     Fall risk assessed and placed on white board. Educated pt on Architectural technologist. Pt wearing non slip socks. Pt stood and ambulated with steady gait with assistive device. Educated pt on call bell system/white board and its use, pt verbalized understanding. During hourly rounding, bed side table, water, personal items, and call bell placed in reach of pt.     Problem: Altered GI Function  Goal: Elimination patterns are normal or improving  Outcome: Progressing   07/26/16 1049   Goal/Interventions addressed this shift   Elimination patterns are normal or improving Report abnormal assessment to physician;Anticipate/assist with toileting needs;Assess for normal bowel sounds;Monitor for abdominal discomfort;Monitor for abdominal distension     Pt on regular diet, -n/v. +BS.     Comments: Status: Pt AOX4, RA, Regular diet, -n/v. Voiding. OOB to BR. Denies weakness/dizziness.    Plan: Reassess pain/n/v and medicate prn. Continue diet. Encourage increased po intake. Assist c/ toileting and ambulation prn. Activity and oob to chair as tolerated. Therapeutic communication prn. Monitor for ssx of infection.

## 2016-07-27 LAB — VITAMIN B12: Vitamin B-12: 1218 pg/mL — ABNORMAL HIGH (ref 211–911)

## 2016-07-27 LAB — VITAMIN D,25 OH,TOTAL: Vitamin D, 25 OH, Total: 14 ng/mL — ABNORMAL LOW (ref 30–100)

## 2016-07-27 LAB — HEMOLYSIS INDEX: Hemolysis Index: 4 (ref 0–18)

## 2016-07-27 MED ORDER — FLUCONAZOLE 100 MG PO TABS
150.0000 mg | ORAL_TABLET | Freq: Once | ORAL | Status: AC
Start: 2016-07-27 — End: 2016-07-27
  Administered 2016-07-27: 20:00:00 150 mg via ORAL
  Filled 2016-07-27: qty 2

## 2016-07-27 MED ORDER — VITAMIN D (ERGOCALCIFEROL) 1.25 MG (50000 UT) PO CAPS
50000.0000 [IU] | ORAL_CAPSULE | ORAL | Status: DC
Start: 2016-07-27 — End: 2016-07-28
  Administered 2016-07-27: 22:00:00 50000 [IU] via ORAL
  Filled 2016-07-27: qty 1

## 2016-07-27 NOTE — Plan of Care (Signed)
Problem: Safety  Goal: Patient will be free from injury during hospitalization  Outcome: Progressing   07/27/16 1104   Goal/Interventions addressed this shift   Patient will be free from injury during hospitalization  Assess patient's risk for falls and implement fall prevention plan of care per policy;Provide and maintain safe environment;Use appropriate transfer methods;Ensure appropriate safety devices are available at the bedside;Hourly rounding;Assess for patients risk for elopement and implement Elopement Risk Plan per policy       Comments: Pt alert and oriented x4 . Encouraged Pt to cough, deep breathe , monitor pain and bleeding. Denies having any pain/sob at the time. Will continued Pt plan of care.

## 2016-07-27 NOTE — Progress Notes (Signed)
S[facility] HOSPITAL[facility]  PROGRESS NOTE      Patient: Wendy Buckley  Date: 07/27/2016   LOS: 10 Days  Admission Date: 07/14/2016   MRN: [id number]  Attending: Carmelina Paddock  Please page me at [phone]       ASSESSMENT/PLAN     Wendy Buckley is a 46 y.o. 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 Significant dizziness with confusion and memory loss -  patient still significant unsteadiness on her feet , persistent  headache, dizziness, nausea, vomiting, photosensitivity      MRI brain without acute findings. There are a few small foci of  hyperintensity identified on long TR images in the right frontal lobe  white matter and the left parietal lobe white matter. Differential  diagnosis includes minimal microvascular ischemic changes. Additional  considerations in the differential diagnosis include demyelinating  disease, vasculitis, and infection such as Lyme disease.  Will send lyme titers   -Speech therapy/OT evaluation /Physical therapy. Recommend acute rehab. Pending insurance authorization  -taper Neurontin 800 to 400  mg 3 times a day  -d/c Robaxin   -Toradol discontinued  -Amitriptyline increased to 50 mg daily  -On Depakote 500 mg twice a day. Taper down to 250 mg bid    HTN-stable  - continue to monitor  Constipation-resolved     Left groin pain  Hx of hernia repair. Pain when she bears down. CT pelvis negative for  inguinal hernia or pelvic fracture     Vitamin D Deficiency   Start supplement      Nutrition: Reg    DVT Prophylaxis: SCDs, lovenox sq    Code Status: Full Code           SUBJECTIVE     She reports ongoing headache, memory loss, dizziness, blurry vision, photosensitivity  She has left inguinal pain    MEDICATIONS     Current Facility-Administered Medications   Medication Dose Route Frequency   . amitriptyline  50 mg Oral QHS   . divalproex  sodium  250 mg Oral Q12H SCH   . enoxaparin  40 mg Subcutaneous Daily   . famotidine  20 mg Oral Q12H SCH   . fluconazole  150 mg Oral Once   . gabapentin  400 mg Oral Q8H SCH   . lidocaine   Topical TID   . nystatin   Topical BID   . polyethylene glycol  17 g Oral Daily   . sodium phosphate  1 enema Rectal Once   . vitamins/minerals  1 tablet Oral Daily       PHYSICAL EXAM     Vitals:    07/27/16 1751   BP: 103/62   Pulse: 93   Resp: 16   Temp: (!) 95.3 F (35.2 C)   SpO2: 98%       Temperature: Temp  Min: 95.3 F (35.2 C)  Max: 98 F (36.7 C)  Pulse: Pulse  Min: 84  Max: 93  Respiratory: Resp  Min: 16  Max: 20  Non-Invasive BP: BP  Min: 100/63  Max: 128/83  Pulse Oximetry SpO2  Min: 97 %  Max: 100 %    Intake and Output Summary (Last 24 hours) at Date Time    Intake/Output Summary (Last 24 hours) at 07/27/16 1909  Last data filed at 07/27/16 1751   Gross per 24 hour  Intake              840 ml   Output                1 ml   Net              839 ml       GEN APPEARANCE: A and Ox3, uses eyescreen  HEENT: No scleral icterus, MMM  CVS: RRR, S1, S2; No M/G/R  LUNGS: CTAB; No Wheezes; No Rhonchi: No rales  ABD: Soft; No TTP; + Normoactive BS, no rebound or guarding  Tender left groin  EXT: WWP,   Skin exam:  No gross lesions noted on exposed skin surfaces  MENTAL STATUS:  Answers questions appropriately, no focal deficit      LABS       Recent Labs  Lab 07/25/16  0850 07/21/16  0949   WBC 10.29 21.38*   RBC 3.95* 3.86*   Hgb 10.3* 10.1*   Hematocrit 33.5* 32.0*   MCV 84.8 82.9   Platelets 291 313         Recent Labs  Lab 07/25/16  0850 07/21/16  0949   Sodium 135* 136   Potassium 4.8 4.5   Chloride 100 104   CO2 28 25   BUN 15.0 25.0*   Creatinine 0.8 0.7   Glucose 93 97   Calcium 8.9 9.3         Recent Labs  Lab 07/25/16  0850   ALT 18   AST (SGOT) 24   Bilirubin, Total 0.1*   Albumin 3.3*   Alkaline Phosphatase 50                   Microbiology Results     None           RADIOLOGY     reviewed      Wendy Buckley  7:09 PM 07/27/2016

## 2016-07-27 NOTE — Progress Notes (Signed)
Pt states she has some redness and irritation inside labia.Also c/o what apears to be a small circular raised red area   In side RT butt cheek. Pt said it is painfull and requests MD to look at it and give medication for it.

## 2016-07-27 NOTE — Progress Notes (Signed)
07/27/2016  Faxed requested info to [facility] [phone].  Waiting on auth for acute rehab.    Van Clines RN, [place]  Case Management  [phone]

## 2016-07-27 NOTE — UM Notes (Signed)
Primary Payor: WORKERS COMP/WORK COMP GENERIC   CSR 07/27/16    Wendy Buckley is a 46 y.o. female admitted with Mild TBI (traumatic brain injury). Pt still reports ongoing headache, memory loss, dizziness, blurry vision, photosensitivity  She has left inguinal pain    BP 95/60   Pulse 93   Temp (!) 96.9 F (36.1 C) (Oral)   Resp 18   Ht 1.676 m (5\' 6" )   Wt 55 kg (121 lb 4.8 oz)   LMP 06/14/2016   SpO2 96%   BMI 19.58 kg/m     Temp:  [95.3 F (35.2 C)-98 F (36.7 C)]   Heart Rate:  [84-93]   Resp Rate:  [16-20]   BP: (95-128)/(60-83)   SpO2:  [96 %-100 %]   Weight:  [55 kg (121 lb 4.8 oz)]     Lab Results last 48 Hours     Procedure Component Value Units Date/Time    Vitamin B12 [[id number]]  (Abnormal) Collected:  07/27/16 0429    Specimen:  Blood Updated:  07/27/16 1057     Vitamin B-12 1,218 (H) pg/mL     Vitamin D,25 OH, Total [[id number]]  (Abnormal) Collected:  07/27/16 0429     Updated:  07/27/16 1005     Vitamin D, 25 OH, Total 14 (L) ng/mL         MD note 07/27/16  Patient Active Hospital Problem List:   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 Significant dizziness with confusion and memory loss -  patient still significant unsteadiness on her feet , persistent  headache, dizziness, nausea, vomiting, photosensitivity   MRI brain without acute findings. There are a few small foci of  hyperintensity identified on long TR images in the right frontal lobe  white matter and the left parietal lobe white matter. Differential  diagnosis includes minimal microvascular ischemic changes. Additional  considerations in the differential diagnosis include demyelinating  disease, vasculitis, and infection such as Lyme disease.  Will send lyme titers   -Speech therapy/OT evaluation /Physical therapy. Recommend acute rehab. Pending insurance authorization  -taper Neurontin 800 to 400  mg 3 times a day  -d/c Robaxin   -Toradol discontinued  -Amitriptyline  increased to 50 mg daily  -On Depakote 500 mg twice a day. Taper down to 250 mg bid  HTN-stable  - continue to monitor  Constipation-resolved   Left groin pain  Hx of hernia repair. Pain when she bears down. CT pelvis negative for  inguinal hernia or pelvic fracture   Vitamin D Deficiency   Start supplement  Nutrition: Reg  DVT Prophylaxis: SCDs, lovenox sq  Code Status: Full Code    Scheduled Meds:  Current Facility-Administered Medications   Medication Dose Route Frequency   . amitriptyline  50 mg Oral QHS   . divalproex sodium  250 mg Oral Q12H SCH   . enoxaparin  40 mg Subcutaneous Daily   . famotidine  20 mg Oral Q12H SCH   . gabapentin  400 mg Oral Q8H SCH   . lidocaine   Topical TID   . nystatin   Topical BID   . polyethylene glycol  17 g Oral Daily   . sodium phosphate  1 enema Rectal Once   . vitamin D (ergocalciferol)  50,000 Units Oral Weekly   . vitamins/minerals  1 tablet Oral Daily     Continuous Infusions:  PRN Meds:.acetaminophen, alum & mag hydroxide-simethicone, diphenhydrAMINE, ibuprofen, iohexol, naloxone, ondansetron **OR** ondansetron, polyethylene  glycol, senna-docusate, zolpidem    Eddie North,  BSN, RN  Utilization Review Case Manager  Case Management Department  [facility]  T [phone] Judie Petit [phone]   [email or web address]  Please submit all clinical review request via fax to [phone]

## 2016-07-27 NOTE — Progress Notes (Addendum)
[facility]  Speech Therapy Treatment Note    Patient:  Wendy Buckley MRN#:  [id number]  Unit:  [facility] Room/Bed:  V4098/J1914-N        Time of treatment:   Start Time: 1430                       Stop Time: 1500      Subjective: Pt awake and alert w/ general c/o headache, nausea, double vision when there is motion, and blurred vision in R eye. Pt w/ new order for ST eval, no change in medical status and already on ST caseload; therefore, pt seen for f/u ST tx session.        Objective: Cognitive-linguistic tx, development and training to utilizeappropriate compensatory memory and attentionstrategies         Assessment: Pt recalled recent episode of taping sheets over the vent to solve the temperature problem and stated that she made the wrong decision. Pt decision indicates decreased problem solving and reasoning skills. Despite recall of recent event, pt continues to present with significantly decreased delayed recall and STM skills as evidenced by performance during today's tx session. Pt recalled first and last name without delay, unable to recall middle name despite significantly increased time. Pt recalled DOB independently within appropriate time frame. Pt required several verbal cues to self-correct age. Pt easily distracted both with and without environmental noise, requires frequent re-direction to task. Pt unable to recall reason for hospitalization. D/t continued decreased memory skills, clinician worked w/ pt to utilize compensatory memory strategy of writing down information including current symptoms to ask the RN and dr about and reason for hospitalization. Pt continues to require f/u w/ ST intervention w/ a frequency of 5xwk and will require skilled ST services when discharged.          Plan/Recommendations: Continue w/ POC.         Charges Minutes   SLP treat 30   Swallow treat        07/27/2016        Eilene Ghazi, MA, CCC-SLP  Extension [phone]

## 2016-07-27 NOTE — Progress Notes (Signed)
Nutritional Support Services  Nutrition Note    Wendy Buckley 46 y.o. female   MRN: [id number]    Referral Source: Census Review  Reason for Referral: LOS-day 10    Diet: Regular     Education Provided: none at this time due to pt being uncooperative with RDN/nutrition assessment.     Clinical Nutrition Summary: Pt presented to the ED with c/o head pain and dizziness following work related head trauma. Pt with TBI with postconcussion syndrome 2/2 work related trauma. Pt with significant dizziness with confusion and memory loss--pt continues to have persistent headache with dizziness and photosensitivity; pt exhibiting aggressive behavior. MRI brain without acute findings, but with small foci of hyperintensity identified; differential include demyelinating disease, vasculitis, and infection (such as Lyme disease).      Pt seen, alert, appears to be agitated at times throughout assessment. Per d/w RN, pt has been told multiple times that MD came to see her today, but was sleeping and plans for MD to return this afternoon. Pt also appears to be forgetful re: POC, ordering food from kitchen. Pt placed lunch order and was upset it had not yet arrived; RDN called dining services and ensured pt that lunch tray was on it's way, pt appeared pleased. Pt reports appetite was good PTA and currently, per RN flow doc, pt's been consuming 50-100% of meals since admission. Pt unable to state UBW, per previous admission documents, pt weighed 143# in 04/2015, pt currently weights 121#; 15.4% weight change in > 1 year, not significant. RDN unable to assess at this time if pt's weight loss is intentional vs. unintentional as pt not very cooperative with RDN assessment. Pt appears thin, but with adequate fat and muscle stores for age. No reports of N/V/D/C +BM 7/20 (pt receiving BM regimen). Will add 1 Ensure Enlive PO daily for more consistent caloric and protein intake.    7/23 BMP reviewed; continue with current diet. Add 1  Ensure Enlive PO daily for more consistent caloric and protein intake.     Patient is at low nutritional risk; will be available within 10 days and PRN.    Omesha Bowerman L. Alleta Avery, MS, RDN  Clinical Dietitian  Ext. [phone]

## 2016-07-28 ENCOUNTER — Inpatient Hospital Stay: Payer: Worker's Comp, Other unspecified

## 2016-07-28 LAB — LYME AB, TOTAL,REFLEX TO WESTERN BLOT (IGG & IGM): Lyme AB,Total,Reflx to WB(IGM): 0.25 (ref 0.00–0.90)

## 2016-07-28 MED ORDER — GABAPENTIN 100 MG PO CAPS
200.0000 mg | ORAL_CAPSULE | Freq: Three times a day (TID) | ORAL | Status: AC
Start: 2016-07-28 — End: ?

## 2016-07-28 MED ORDER — GABAPENTIN 100 MG PO CAPS
200.0000 mg | ORAL_CAPSULE | Freq: Three times a day (TID) | ORAL | Status: DC
Start: 2016-07-28 — End: 2016-07-28
  Administered 2016-07-28: 14:00:00 200 mg via ORAL
  Filled 2016-07-28: qty 2

## 2016-07-28 MED ORDER — IBUPROFEN 800 MG PO TABS
800.0000 mg | ORAL_TABLET | Freq: Three times a day (TID) | ORAL | Status: AC | PRN
Start: 2016-07-28 — End: ?

## 2016-07-28 NOTE — Progress Notes (Signed)
Attempted twice to call report to [facility], unable to reach anyone at this time. Updated night RN Nicholos Johns, questions answered.

## 2016-07-28 NOTE — PT Progress Note (Signed)
[facility]  Physical Therapy Attempt Note    Patient:  Wendy Buckley MRN#:  [id number]  Unit:  [address] [facility] Room/Bed:  O1308/M5784-O    Chart reviewed and d/w RN prior to entry who states pt with fall in bathroom this morning but cleared for PT attempt. Physical Therapy treatment attempted on 07/28/2016, unable to complete secondary to pt sleeping and declining PT. "I sleepy. You going to come back later. I sleepy." Pt expressing discomfort in LE's, stating "they going to take Xray." PT deferred per pt request to be left to sleep. Will continue to follow as per POC.    Roxy Cedar, [place]  [id number]  07/28/2016 1:40 PM

## 2016-07-28 NOTE — Plan of Care (Signed)
Problem: Safety  Goal: Patient will be free from injury during hospitalization  Outcome: Progressing   07/28/16 9563   Goal/Interventions addressed this shift   Patient will be free from injury during hospitalization  Assess patient's risk for falls and implement fall prevention plan of care per policy;Provide and maintain safe environment;Use appropriate transfer methods;Include patient/ family/ care giver in decisions related to safety;Ensure appropriate safety devices are available at the bedside;Hourly rounding       Problem: Discharge Barriers  Goal: Patient will be discharged home or other facility with appropriate resources  Outcome: Progressing   07/28/16 0237   Goal/Interventions addressed this shift   Discharge to home or other facility with appropriate resources Provide appropriate patient education       Problem: Psychosocial and Spiritual Needs  Goal: Demonstrates ability to cope with hospitalization/illness  Outcome: Progressing   07/28/16 0237   Goal/Interventions addressed this shift   Demonstrates ability to cope with hospitalizations/illness Encourage verbalization of feelings/concerns/expectations;Provide quiet environment;Include patient/ patient care companion in decisions       Problem: Moderate/High Fall Risk Score >5  Goal: Patient will remain free of falls  Outcome: Progressing      Problem: Impaired Mobility  Goal: Mobility/Activity is maintained at optimal level for patient  Outcome: Progressing   07/28/16 0237   Goal/Interventions addressed this shift   Mobility/activity is maintained at optimal level for patient Increase mobility as tolerated/progressive mobility;Encourage independent activity per ability;Maintain proper body alignment       Problem: Anxiety  Goal: Anxiety is at a manageable level  Outcome: Progressing   07/28/16 0237   Goal/Interventions addressed this shift   Anxiety is at a manageable level Inform/explain to patient/patient care companion all  tests/procedures/treatment/care prior to initiation;Facilitate expression of feelings, fears, concerns, anxiety;Assess emotional status and coping mechanisms;Provide alternatives to reduce anxiety;Provide and maintain a safe environment;Provide emotional support       Comments: Pt has difficulty with short term memory loss.  Education and reorientation provided with each interaction with patient.  Diflucan was given for vagina itching from Nystatin application.

## 2016-07-28 NOTE — Significant Event (Addendum)
At about 0845, patient called from restroom, RN responded and found patient sitting on the floor in the restroom without socks in place. Patient reported to RN that she slipped while trying to use the restroom. Patient reported to RN feels pain to the left shoulder, left wrist, and bilateral knees. Patient assisted up with help of 2 RNs and patient able to ambulate back to bed without issue. Patient positioned comfortably in bed. Vital signs obtained. No s/s of distress noted at this time. Per patient's request, refusing to have family notified. Dr Milagros Evener notified of above. Nursing Supervisor Rocky Link notified. Charge RN Katrina notified. Fall/safety precautions maintained, education provided to patient regarding fall/safety precautions, patient stated understanding; bed alarm in use; call light and phone within reach; patient refusing to wear yellow socks in bed but states understanding socks need to be used when getting out of bed. All needs anticipated and cared for. Will continue to monitor and follow up.

## 2016-07-28 NOTE — Progress Notes (Signed)
Pt discharged to [facility] transported by EMS;instruction given & explained to patient & she verbalized understanding.She's AOx4;vss.

## 2016-07-28 NOTE — Plan of Care (Signed)
Problem: Safety  Goal: Patient will be free from injury during hospitalization  Outcome: Progressing   07/28/16 1030   Goal/Interventions addressed this shift   Patient will be free from injury during hospitalization  Assess patient's risk for falls and implement fall prevention plan of care per policy;Provide and maintain safe environment;Use appropriate transfer methods;Ensure appropriate safety devices are available at the bedside;Include patient/ family/ care giver in decisions related to safety;Hourly rounding;Assess for patients risk for elopement and implement Elopement Risk Plan per policy;Provide alternative method of communication if needed (communication boards, writing)       Problem: Neurological Deficit  Goal: Neurological status is stable or improving  Outcome: Progressing   07/28/16 1030   Goal/Interventions addressed this shift   Neurological status is stable or improving Monitor/assess/document neurological assessment (Stroke: every 4 hours);Monitor/assess NIH Stroke Scale;Re-assess NIH Stroke Scale for any change in status;Observe for seizure activity and initiate seizure precautions if indicated;Perform CAM Assessment       Problem: Impaired Mobility  Goal: Mobility/Activity is maintained at optimal level for patient  Outcome: Progressing   07/28/16 1030   Goal/Interventions addressed this shift   Mobility/activity is maintained at optimal level for patient Increase mobility as tolerated/progressive mobility;Encourage independent activity per ability;Perform active/passive ROM;Maintain proper body alignment;Plan activities to conserve energy, plan rest periods;Reposition patient every 2 hours and as needed unless able to reposition self;Assess for changes in respiratory status, level of consciousness and/or development of fatigue;Consult/collaborate with Physical Therapy and/or Occupational Therapy   The learning of the patient has been assessed. Today's individualized plan of care includes:  monitor vital signs, monitor neuro checks, maintain safety/fall precautions, pain management as per doctor order; patient remains a/ox4, with short term memory loss noted, easily reoriented, reassurance and emotional support provided; patient able to move all extremities, patient complains of left wrist pain/generalized pain at times medicated as per doctor order; patient maintained on RA without issue; no s/s or c/o chest pain/pressure/discomfort/dizziness/sob or headache at this time; all needs anticipated and cared for; fall/safety precautions maintained; will continue to monitor. Patient agrees to plan of care and demonstrates understanding of disease process, treatment plan, and medical consequences of noncompliance at this time; frequent rounding. All questions and concerns addressed.

## 2016-07-28 NOTE — Progress Notes (Signed)
07/28/16 1633   Discharge Disposition   Patient preference/choice provided? Yes   Physical Discharge Disposition Acute Rehab   Receiving facility, unit and room number: [facility]  rm 100   Nursing report phone number: [phone]   Mode of Transportation Ambulance   Pick up time 2100   Patient/Family/POA notified of transfer plan Yes  (pt says she wants to call her brother)   Patient agreeable to discharge plan/expected d/c date? Yes   Family/POA agreeable to discharge plan/expected d/c date? Yes   Bedside nurse notified of transport plan? Yes   Hard copy of narcotic RX sent with patient? N/A   Hard copy of DNR/Advance Directive sent with patient? Yes   IV antibiotics post discharge? N/A   Wound care post discharge? N/A   CM Interventions   Follow up appointment scheduled? No   Referral made for home health RN visit? No, Other (comment)   Multidisciplinary rounds/family meeting before d/c? Yes   Medicare Checklist   Is this a Medicare patient? No   Patient received 1st IMM Letter? n/a   07/28/2016  Medically cleared for d/c.  Pt aware of d/c and in agreement with d/c.  Pt says she will tell her brother she is transferring and declined having the CM call him.  Packet completed and given to the nurse to call report.  Transportation arranged via ambulance for 2000.  No further needs identified.  Van Clines RN, [place]  Case Management  [phone]

## 2016-07-28 NOTE — Discharge Summary (Signed)
[facility]      Patient: Wendy Buckley  Admission Date: 07/14/2016   DOB: 21-Apr-1970  Discharge Date: 07/28/2016    MRN: [id number]  Discharge Attending:Elizabethanne Lusher Milagros Evener     Referring Physician: [name], MD  PCP: [name], MD       DISCHARGE SUMMARY     Discharge Information   Admission Diagnosis:   Mild TBI (traumatic brain injury)    Discharge Diagnosis:   Active Hospital Problems    Diagnosis   . Mild TBI (traumatic brain injury)   . Altered mental status        Admission Condition: acute  Discharge Condition: stable  Consultants: neurology  Functional Status: below baseline  Discharged to: acute rehab    Discharge Medications:     Medication List      START taking these medications    acetaminophen 325 MG tablet  Commonly known as:  TYLENOL  Take 2 tablets (650 mg total) by mouth every 6 (six) hours as needed for Pain.  Notes to patient:  Last dose given:   Next dose due:      amitriptyline 25 MG tablet  Commonly known as:  ELAVIL  Take 1 tablet (25 mg total) by mouth nightly.  Notes to patient:  Last dose given:   Next dose due:      gabapentin 100 MG capsule  Commonly known as:  NEURONTIN  Take 2 capsules (200 mg total) by mouth every 8 (eight) hours.     ibuprofen 800 MG tablet  Commonly known as:  ADVIL,MOTRIN  Take 1 tablet (800 mg total) by mouth every 8 (eight) hours as needed for Pain.     ondansetron 4 MG disintegrating tablet  Commonly known as:  ZOFRAN-ODT  Take 1 tablet (4 mg total) by mouth every 6 (six) hours as needed for Nausea.  Notes to patient:  Last dose given:   Next dose due:      polyethylene glycol packet  Commonly known as:  MIRALAX  Take 17 g by mouth daily as needed (constipation).  Notes to patient:  Last dose given:   Next dose due:            Where to Get Your Medications      Information about where to get these medications is not yet available    Ask your nurse or doctor about these medications   acetaminophen 325 MG tablet   amitriptyline 25 MG  tablet   gabapentin 100 MG capsule   ibuprofen 800 MG tablet   ondansetron 4 MG disintegrating tablet   polyethylene glycol packet             Hospital Course   Presentation History   Wendy Buckley is a 46 y.o. female with no significant medical history who presented with head pain and dizziness following work related head trauma. 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  Patient did not have loss of consciousness, but complained of pain and dizziness.  She was sensitive outside hospital emergency department for evaluation where had imaging was unremarkable.  Patient's continued to have dizziness, confusion/disorientation, and blurry vision in her right eye.  Patient also had elevated blood pressure and intractable nausea and vomiting.  She was transferred to another [place] for inpatient observation and monitoring.    See HPI for details.  Hospital Course     :S/p head injury resulting in HA, dizziness, confusion/disorientation in a 46 y/o female with no significant PMH. MRI brain without signfiicant findings. rEEG without seizures. Confusion/mental status improving though HA, dizziness, and vomiting persist. Suspect a post concussive syndrome (symptoms can last months after a mild TBI) . Patient was treated for headache with Toradol 30 mg q8h, Compazine 10 mg TID, Benadryl 12.5 mg TID, Magnesium 1 gm BID, Depakote 500 mg q12h (ordered), Solumedrol 500 mg IV daily, Elavil 25 mg qhs  Robaxin QID,Neurontin 800 mg TID but made minimal progress.She was seen by PT/OT/speech therapy and it was suggested that she would benefit from acute rehab since her symptoms could last for several weeks.   Patient has persistent symptoms, she was not accepted at the acute rehabilitation.  Weaning medications down. Neurontin down to 200 mg TID  Cont Amtitriptiline 50 mg at bedtime    Procedures/Imaging:    CT Pelvis W PO Contrast Only [[id number]] Collected: 07/26/16 0814   Order Status: Completed Updated: 07/26/16 0821   Narrative:    CT PELVIS WITHOUT CONTRAST    CLINICAL STATEMENT: History of inguinal hernia now with left inguinal  pain. Mass, left inguinal region. Possible lymphadenopathy or  collection.     COMPARISON: No prior studies are available for comparison.     TECHNIQUE: Multiple thin section axial images were performed from the  lower abdomen to the proximal femurs without the administration of  intravenous contrast and following bowel contrast. Coronal and sagittal  reformatted images were obtained.    Note that CT scanning at this site utilizes multiple dose reduction  techniques including automatic exposure control, adjustment of the MAS  and/or KVP according to patient size, and use of iterative  reconstruction technique.    FINDINGS:     Postsurgical changes are noted of the left inguinal region. There is no  mass, collection or recurrent hernia.    Peritoneal cavity: There is no ascites. A small fat-containing hernia.    Hollow viscus: The visualized portions of the large and small bowel are  normal caliber. The appendix is unremarkable in appearance.     Lymph nodes: There is no pelvic lymphadenopathy.     Aorta and vasculature: Unremarkable and patent.     Miscellaneous pelvis: The uterus is enlarged, and lobular containing  multiple leiomyomas.    Musculoskeletal: The osseous structures are intact. No suspicious  osseous lesions can be identified.   Impression:      1. No evidence of inguinal lymphadenopathy, collection, mass or  recurrent hernia.  2. Myomatous uterus.             Best Practices   Was the patient admitted with either a CHF Exacerbation or Pneumonia? no     Progress Note/Physical Exam at Discharge     Subjective: cont to have headache, memory issues, tinnitus     Vitals:    07/28/16 0500 07/28/16 0701 07/28/16 0852 07/28/16 1158   BP: 112/72 106/66 (!) 136/98 111/65    Pulse: 100 76 (!) 104 88   Resp: 18 18 18 18    Temp: 97.5 F (36.4 C) 97.2 F (36.2 C) (!) 96.5 F (35.8 C) (!) 96.2 F (35.7 C)   TempSrc: Oral Oral  Oral   SpO2: 98% 99% 100% 99%   Weight: 55.2 kg (121 lb 11.2 oz)      Height:               General: NAD,  AAOx3  HEENT: perrla, eomi, sclera anicteric, OP: Clear, MMM  Neck: supple, FROM, no LAD  Cardiovascular: RRR, no m/r/g  Lungs: CTAB, no w/r/r  Abdomen: soft, +BS, NT/ND, no masses, no g/r  Extremities: no C/C/E  Skin: no rashes or lesions noted  Neuro: CN 2-12 intact; No Focal neurological deficits       Diagnostics     Labs/Studies Pending at Discharge: No    Last Labs     Recent Labs  Lab 07/25/16  0850   WBC 10.29   RBC 3.95*   Hgb 10.3*   Hematocrit 33.5*   MCV 84.8   Platelets 291         Recent Labs  Lab 07/25/16  0850   Sodium 135*   Potassium 4.8   Chloride 100   CO2 28   BUN 15.0   Creatinine 0.8   Glucose 93   Calcium 8.9       Microbiology Results     None           Patient Instructions   Discharge Diet: regular  Discharge Activity: as tolerated    Follow Up Appointment:  Follow-up Information     [facility]. Schedule an appointment as soon as possible for a visit in 1 week(s).    Contact information:  [address]  [address]  [place] [address]  [phone]           Vivi Martens, MD PhD. Schedule an appointment as soon as possible for a visit in 1 week(s).    Specialty:  Neurology  Contact information:  [address]  [place] [address]  [phone]             [name], MD .                  Time spent examining patient, discussing with patient/family regarding hospital course, chart review, reconciling medications and discharge planning: 45 minutes.    Signed,  Carmelina Paddock    2:52 PM 07/28/2016
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
## 2016-08-28 ENCOUNTER — Ambulatory Visit (INDEPENDENT_AMBULATORY_CARE_PROVIDER_SITE_OTHER): Payer: Self-pay | Admitting: Nurse Practitioner

## 2016-09-10 ENCOUNTER — Inpatient Hospital Stay (INDEPENDENT_AMBULATORY_CARE_PROVIDER_SITE_OTHER): Payer: Self-pay | Admitting: Neurology

## 2016-09-23 ENCOUNTER — Ambulatory Visit (INDEPENDENT_AMBULATORY_CARE_PROVIDER_SITE_OTHER): Payer: Self-pay | Admitting: Neurology
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
